# Patient Record
Sex: Female | Born: 1940
Health system: Southern US, Community
[De-identification: ages and names within clinical notes are randomized; demographics above are authoritative.]

## PROBLEM LIST (undated history)

## (undated) DIAGNOSIS — N289 Disorder of kidney and ureter, unspecified: Secondary | ICD-10-CM

## (undated) DIAGNOSIS — E871 Hypo-osmolality and hyponatremia: Secondary | ICD-10-CM

## (undated) DIAGNOSIS — F0280 Dementia in other diseases classified elsewhere without behavioral disturbance: Secondary | ICD-10-CM

## (undated) DIAGNOSIS — K219 Gastro-esophageal reflux disease without esophagitis: Secondary | ICD-10-CM

## (undated) DIAGNOSIS — N3090 Cystitis, unspecified without hematuria: Secondary | ICD-10-CM

---

## 2020-03-02 DIAGNOSIS — G4700 Insomnia, unspecified: Secondary | ICD-10-CM | POA: Diagnosis not present

## 2020-03-02 DIAGNOSIS — F0390 Unspecified dementia without behavioral disturbance: Secondary | ICD-10-CM | POA: Diagnosis not present

## 2020-03-02 DIAGNOSIS — F419 Anxiety disorder, unspecified: Secondary | ICD-10-CM | POA: Diagnosis not present

## 2020-03-02 DIAGNOSIS — F339 Major depressive disorder, recurrent, unspecified: Secondary | ICD-10-CM | POA: Diagnosis not present

## 2020-04-06 DIAGNOSIS — F339 Major depressive disorder, recurrent, unspecified: Secondary | ICD-10-CM | POA: Diagnosis not present

## 2020-04-06 DIAGNOSIS — G4700 Insomnia, unspecified: Secondary | ICD-10-CM | POA: Diagnosis not present

## 2020-04-06 DIAGNOSIS — F419 Anxiety disorder, unspecified: Secondary | ICD-10-CM | POA: Diagnosis not present

## 2020-04-06 DIAGNOSIS — F0280 Dementia in other diseases classified elsewhere without behavioral disturbance: Secondary | ICD-10-CM | POA: Diagnosis not present

## 2020-04-20 DIAGNOSIS — R1312 Dysphagia, oropharyngeal phase: Secondary | ICD-10-CM | POA: Diagnosis not present

## 2020-04-20 DIAGNOSIS — K219 Gastro-esophageal reflux disease without esophagitis: Secondary | ICD-10-CM | POA: Diagnosis not present

## 2020-04-20 DIAGNOSIS — F0390 Unspecified dementia without behavioral disturbance: Secondary | ICD-10-CM | POA: Diagnosis not present

## 2020-05-08 DIAGNOSIS — G301 Alzheimer's disease with late onset: Secondary | ICD-10-CM | POA: Diagnosis not present

## 2020-05-08 DIAGNOSIS — F0280 Dementia in other diseases classified elsewhere without behavioral disturbance: Secondary | ICD-10-CM | POA: Diagnosis not present

## 2020-05-08 DIAGNOSIS — F339 Major depressive disorder, recurrent, unspecified: Secondary | ICD-10-CM | POA: Diagnosis not present

## 2020-05-08 DIAGNOSIS — G4701 Insomnia due to medical condition: Secondary | ICD-10-CM | POA: Diagnosis not present

## 2020-05-09 DIAGNOSIS — Z9181 History of falling: Secondary | ICD-10-CM | POA: Diagnosis not present

## 2020-05-09 DIAGNOSIS — F0280 Dementia in other diseases classified elsewhere without behavioral disturbance: Secondary | ICD-10-CM | POA: Diagnosis not present

## 2020-05-09 DIAGNOSIS — Z87440 Personal history of urinary (tract) infections: Secondary | ICD-10-CM | POA: Diagnosis not present

## 2020-05-09 DIAGNOSIS — R1310 Dysphagia, unspecified: Secondary | ICD-10-CM | POA: Diagnosis not present

## 2020-05-09 DIAGNOSIS — G309 Alzheimer's disease, unspecified: Secondary | ICD-10-CM | POA: Diagnosis not present

## 2020-05-09 DIAGNOSIS — K219 Gastro-esophageal reflux disease without esophagitis: Secondary | ICD-10-CM | POA: Diagnosis not present

## 2020-05-09 DIAGNOSIS — N189 Chronic kidney disease, unspecified: Secondary | ICD-10-CM | POA: Diagnosis not present

## 2020-05-11 DIAGNOSIS — R410 Disorientation, unspecified: Secondary | ICD-10-CM | POA: Diagnosis not present

## 2020-05-11 DIAGNOSIS — F0390 Unspecified dementia without behavioral disturbance: Secondary | ICD-10-CM | POA: Diagnosis not present

## 2020-05-16 DIAGNOSIS — K219 Gastro-esophageal reflux disease without esophagitis: Secondary | ICD-10-CM | POA: Diagnosis not present

## 2020-05-16 DIAGNOSIS — R1310 Dysphagia, unspecified: Secondary | ICD-10-CM | POA: Diagnosis not present

## 2020-05-16 DIAGNOSIS — Z87440 Personal history of urinary (tract) infections: Secondary | ICD-10-CM | POA: Diagnosis not present

## 2020-05-16 DIAGNOSIS — Z9181 History of falling: Secondary | ICD-10-CM | POA: Diagnosis not present

## 2020-05-16 DIAGNOSIS — F0280 Dementia in other diseases classified elsewhere without behavioral disturbance: Secondary | ICD-10-CM | POA: Diagnosis not present

## 2020-05-16 DIAGNOSIS — G309 Alzheimer's disease, unspecified: Secondary | ICD-10-CM | POA: Diagnosis not present

## 2020-05-16 DIAGNOSIS — N189 Chronic kidney disease, unspecified: Secondary | ICD-10-CM | POA: Diagnosis not present

## 2020-05-19 DIAGNOSIS — N189 Chronic kidney disease, unspecified: Secondary | ICD-10-CM | POA: Diagnosis not present

## 2020-05-19 DIAGNOSIS — F0280 Dementia in other diseases classified elsewhere without behavioral disturbance: Secondary | ICD-10-CM | POA: Diagnosis not present

## 2020-05-19 DIAGNOSIS — G309 Alzheimer's disease, unspecified: Secondary | ICD-10-CM | POA: Diagnosis not present

## 2020-05-19 DIAGNOSIS — R1310 Dysphagia, unspecified: Secondary | ICD-10-CM | POA: Diagnosis not present

## 2020-05-19 DIAGNOSIS — K219 Gastro-esophageal reflux disease without esophagitis: Secondary | ICD-10-CM | POA: Diagnosis not present

## 2020-05-19 DIAGNOSIS — Z87440 Personal history of urinary (tract) infections: Secondary | ICD-10-CM | POA: Diagnosis not present

## 2020-05-19 DIAGNOSIS — Z9181 History of falling: Secondary | ICD-10-CM | POA: Diagnosis not present

## 2020-05-22 DIAGNOSIS — S01512A Laceration without foreign body of oral cavity, initial encounter: Secondary | ICD-10-CM | POA: Diagnosis not present

## 2020-05-22 DIAGNOSIS — S022XXA Fracture of nasal bones, initial encounter for closed fracture: Secondary | ICD-10-CM | POA: Diagnosis not present

## 2020-05-22 DIAGNOSIS — S0011XA Contusion of right eyelid and periocular area, initial encounter: Secondary | ICD-10-CM | POA: Diagnosis not present

## 2020-05-22 DIAGNOSIS — J342 Deviated nasal septum: Secondary | ICD-10-CM | POA: Diagnosis not present

## 2020-05-22 DIAGNOSIS — W19XXXA Unspecified fall, initial encounter: Secondary | ICD-10-CM | POA: Diagnosis not present

## 2020-05-22 DIAGNOSIS — R609 Edema, unspecified: Secondary | ICD-10-CM | POA: Diagnosis not present

## 2020-05-22 DIAGNOSIS — Z043 Encounter for examination and observation following other accident: Secondary | ICD-10-CM | POA: Diagnosis not present

## 2020-05-23 DIAGNOSIS — Z87440 Personal history of urinary (tract) infections: Secondary | ICD-10-CM | POA: Diagnosis not present

## 2020-05-23 DIAGNOSIS — N189 Chronic kidney disease, unspecified: Secondary | ICD-10-CM | POA: Diagnosis not present

## 2020-05-23 DIAGNOSIS — F0280 Dementia in other diseases classified elsewhere without behavioral disturbance: Secondary | ICD-10-CM | POA: Diagnosis not present

## 2020-05-23 DIAGNOSIS — R1310 Dysphagia, unspecified: Secondary | ICD-10-CM | POA: Diagnosis not present

## 2020-05-23 DIAGNOSIS — K219 Gastro-esophageal reflux disease without esophagitis: Secondary | ICD-10-CM | POA: Diagnosis not present

## 2020-05-23 DIAGNOSIS — Z9181 History of falling: Secondary | ICD-10-CM | POA: Diagnosis not present

## 2020-05-23 DIAGNOSIS — G309 Alzheimer's disease, unspecified: Secondary | ICD-10-CM | POA: Diagnosis not present

## 2020-05-25 DIAGNOSIS — R5381 Other malaise: Secondary | ICD-10-CM | POA: Diagnosis not present

## 2020-05-25 DIAGNOSIS — W19XXXA Unspecified fall, initial encounter: Secondary | ICD-10-CM | POA: Diagnosis not present

## 2020-05-25 DIAGNOSIS — F0390 Unspecified dementia without behavioral disturbance: Secondary | ICD-10-CM | POA: Diagnosis not present

## 2020-05-25 DIAGNOSIS — R296 Repeated falls: Secondary | ICD-10-CM | POA: Diagnosis not present

## 2020-05-25 DIAGNOSIS — S022XXA Fracture of nasal bones, initial encounter for closed fracture: Secondary | ICD-10-CM | POA: Diagnosis not present

## 2020-05-26 ENCOUNTER — Other Ambulatory Visit: Payer: Self-pay

## 2020-05-26 ENCOUNTER — Emergency Department (HOSPITAL_COMMUNITY): Payer: Medicare HMO

## 2020-05-26 ENCOUNTER — Observation Stay (HOSPITAL_COMMUNITY): Payer: Medicare HMO

## 2020-05-26 ENCOUNTER — Inpatient Hospital Stay (HOSPITAL_COMMUNITY)
Admission: EM | Admit: 2020-05-26 | Discharge: 2020-05-31 | DRG: 085 | Disposition: A | Discharge status: Home Health | Payer: Medicare HMO | Source: Skilled Nursing Facility | Attending: Internal Medicine | Admitting: Internal Medicine

## 2020-05-26 ENCOUNTER — Encounter (HOSPITAL_COMMUNITY): Payer: Self-pay | Admitting: Emergency Medicine

## 2020-05-26 DIAGNOSIS — G309 Alzheimer's disease, unspecified: Secondary | ICD-10-CM | POA: Diagnosis present

## 2020-05-26 DIAGNOSIS — S0191XA Laceration without foreign body of unspecified part of head, initial encounter: Secondary | ICD-10-CM | POA: Diagnosis not present

## 2020-05-26 DIAGNOSIS — F0280 Dementia in other diseases classified elsewhere without behavioral disturbance: Secondary | ICD-10-CM | POA: Diagnosis present

## 2020-05-26 DIAGNOSIS — E785 Hyperlipidemia, unspecified: Secondary | ICD-10-CM | POA: Diagnosis present

## 2020-05-26 DIAGNOSIS — S0181XA Laceration without foreign body of other part of head, initial encounter: Secondary | ICD-10-CM | POA: Diagnosis not present

## 2020-05-26 DIAGNOSIS — Z23 Encounter for immunization: Secondary | ICD-10-CM

## 2020-05-26 DIAGNOSIS — Z882 Allergy status to sulfonamides status: Secondary | ICD-10-CM | POA: Diagnosis not present

## 2020-05-26 DIAGNOSIS — S0011XA Contusion of right eyelid and periocular area, initial encounter: Secondary | ICD-10-CM | POA: Diagnosis present

## 2020-05-26 DIAGNOSIS — Z8249 Family history of ischemic heart disease and other diseases of the circulatory system: Secondary | ICD-10-CM

## 2020-05-26 DIAGNOSIS — W07XXXA Fall from chair, initial encounter: Secondary | ICD-10-CM | POA: Diagnosis present

## 2020-05-26 DIAGNOSIS — M5030 Other cervical disc degeneration, unspecified cervical region: Secondary | ICD-10-CM | POA: Diagnosis not present

## 2020-05-26 DIAGNOSIS — K219 Gastro-esophageal reflux disease without esophagitis: Secondary | ICD-10-CM | POA: Diagnosis present

## 2020-05-26 DIAGNOSIS — J810 Acute pulmonary edema: Secondary | ICD-10-CM

## 2020-05-26 DIAGNOSIS — I6389 Other cerebral infarction: Secondary | ICD-10-CM | POA: Diagnosis not present

## 2020-05-26 DIAGNOSIS — I672 Cerebral atherosclerosis: Secondary | ICD-10-CM | POA: Diagnosis not present

## 2020-05-26 DIAGNOSIS — R0902 Hypoxemia: Secondary | ICD-10-CM | POA: Diagnosis not present

## 2020-05-26 DIAGNOSIS — J9811 Atelectasis: Secondary | ICD-10-CM | POA: Diagnosis not present

## 2020-05-26 DIAGNOSIS — K317 Polyp of stomach and duodenum: Secondary | ICD-10-CM | POA: Diagnosis not present

## 2020-05-26 DIAGNOSIS — S0012XA Contusion of left eyelid and periocular area, initial encounter: Secondary | ICD-10-CM | POA: Diagnosis present

## 2020-05-26 DIAGNOSIS — G9608 Other cranial cerebrospinal fluid leak: Secondary | ICD-10-CM | POA: Diagnosis present

## 2020-05-26 DIAGNOSIS — Z20822 Contact with and (suspected) exposure to covid-19: Secondary | ICD-10-CM | POA: Diagnosis present

## 2020-05-26 DIAGNOSIS — Z79899 Other long term (current) drug therapy: Secondary | ICD-10-CM | POA: Diagnosis not present

## 2020-05-26 DIAGNOSIS — R9431 Abnormal electrocardiogram [ECG] [EKG]: Secondary | ICD-10-CM | POA: Diagnosis not present

## 2020-05-26 DIAGNOSIS — S065X9A Traumatic subdural hemorrhage with loss of consciousness of unspecified duration, initial encounter: Secondary | ICD-10-CM

## 2020-05-26 DIAGNOSIS — K922 Gastrointestinal hemorrhage, unspecified: Secondary | ICD-10-CM

## 2020-05-26 DIAGNOSIS — Z823 Family history of stroke: Secondary | ICD-10-CM | POA: Diagnosis not present

## 2020-05-26 DIAGNOSIS — H510 Palsy (spasm) of conjugate gaze: Secondary | ICD-10-CM | POA: Diagnosis present

## 2020-05-26 DIAGNOSIS — J189 Pneumonia, unspecified organism: Secondary | ICD-10-CM | POA: Diagnosis not present

## 2020-05-26 DIAGNOSIS — D62 Acute posthemorrhagic anemia: Secondary | ICD-10-CM | POA: Diagnosis present

## 2020-05-26 DIAGNOSIS — F419 Anxiety disorder, unspecified: Secondary | ICD-10-CM | POA: Diagnosis present

## 2020-05-26 DIAGNOSIS — Z8262 Family history of osteoporosis: Secondary | ICD-10-CM | POA: Diagnosis not present

## 2020-05-26 DIAGNOSIS — Q399 Congenital malformation of esophagus, unspecified: Secondary | ICD-10-CM | POA: Diagnosis not present

## 2020-05-26 DIAGNOSIS — I639 Cerebral infarction, unspecified: Secondary | ICD-10-CM

## 2020-05-26 DIAGNOSIS — Z743 Need for continuous supervision: Secondary | ICD-10-CM | POA: Diagnosis not present

## 2020-05-26 DIAGNOSIS — S0990XA Unspecified injury of head, initial encounter: Secondary | ICD-10-CM | POA: Diagnosis not present

## 2020-05-26 DIAGNOSIS — R0602 Shortness of breath: Secondary | ICD-10-CM | POA: Diagnosis not present

## 2020-05-26 DIAGNOSIS — K449 Diaphragmatic hernia without obstruction or gangrene: Secondary | ICD-10-CM | POA: Diagnosis present

## 2020-05-26 DIAGNOSIS — R410 Disorientation, unspecified: Secondary | ICD-10-CM | POA: Diagnosis not present

## 2020-05-26 DIAGNOSIS — D649 Anemia, unspecified: Secondary | ICD-10-CM

## 2020-05-26 DIAGNOSIS — R296 Repeated falls: Secondary | ICD-10-CM | POA: Diagnosis present

## 2020-05-26 DIAGNOSIS — G9389 Other specified disorders of brain: Secondary | ICD-10-CM | POA: Diagnosis not present

## 2020-05-26 DIAGNOSIS — D509 Iron deficiency anemia, unspecified: Secondary | ICD-10-CM | POA: Diagnosis present

## 2020-05-26 DIAGNOSIS — S065X0A Traumatic subdural hemorrhage without loss of consciousness, initial encounter: Principal | ICD-10-CM | POA: Diagnosis present

## 2020-05-26 DIAGNOSIS — S199XXA Unspecified injury of neck, initial encounter: Secondary | ICD-10-CM | POA: Diagnosis not present

## 2020-05-26 DIAGNOSIS — R279 Unspecified lack of coordination: Secondary | ICD-10-CM | POA: Diagnosis not present

## 2020-05-26 DIAGNOSIS — I959 Hypotension, unspecified: Secondary | ICD-10-CM | POA: Diagnosis not present

## 2020-05-26 DIAGNOSIS — E876 Hypokalemia: Secondary | ICD-10-CM | POA: Diagnosis present

## 2020-05-26 DIAGNOSIS — D500 Iron deficiency anemia secondary to blood loss (chronic): Secondary | ICD-10-CM | POA: Diagnosis present

## 2020-05-26 DIAGNOSIS — I2510 Atherosclerotic heart disease of native coronary artery without angina pectoris: Secondary | ICD-10-CM | POA: Diagnosis not present

## 2020-05-26 DIAGNOSIS — Y92129 Unspecified place in nursing home as the place of occurrence of the external cause: Secondary | ICD-10-CM

## 2020-05-26 DIAGNOSIS — R195 Other fecal abnormalities: Secondary | ICD-10-CM | POA: Diagnosis not present

## 2020-05-26 DIAGNOSIS — S0101XA Laceration without foreign body of scalp, initial encounter: Secondary | ICD-10-CM | POA: Diagnosis not present

## 2020-05-26 DIAGNOSIS — S0003XA Contusion of scalp, initial encounter: Secondary | ICD-10-CM | POA: Diagnosis present

## 2020-05-26 DIAGNOSIS — D181 Lymphangioma, any site: Secondary | ICD-10-CM | POA: Diagnosis not present

## 2020-05-26 DIAGNOSIS — J90 Pleural effusion, not elsewhere classified: Secondary | ICD-10-CM | POA: Diagnosis present

## 2020-05-26 DIAGNOSIS — W19XXXA Unspecified fall, initial encounter: Secondary | ICD-10-CM | POA: Diagnosis not present

## 2020-05-26 DIAGNOSIS — K257 Chronic gastric ulcer without hemorrhage or perforation: Secondary | ICD-10-CM | POA: Diagnosis not present

## 2020-05-26 DIAGNOSIS — K259 Gastric ulcer, unspecified as acute or chronic, without hemorrhage or perforation: Secondary | ICD-10-CM | POA: Diagnosis present

## 2020-05-26 DIAGNOSIS — Z8673 Personal history of transient ischemic attack (TIA), and cerebral infarction without residual deficits: Secondary | ICD-10-CM | POA: Diagnosis not present

## 2020-05-26 DIAGNOSIS — S065XAA Traumatic subdural hemorrhage with loss of consciousness status unknown, initial encounter: Secondary | ICD-10-CM

## 2020-05-26 HISTORY — DX: Dementia in other diseases classified elsewhere, unspecified severity, without behavioral disturbance, psychotic disturbance, mood disturbance, and anxiety: F02.80

## 2020-05-26 HISTORY — DX: Disorder of kidney and ureter, unspecified: N28.9

## 2020-05-26 HISTORY — DX: Gastro-esophageal reflux disease without esophagitis: K21.9

## 2020-05-26 HISTORY — DX: Cystitis, unspecified without hematuria: N30.90

## 2020-05-26 HISTORY — DX: Hypo-osmolality and hyponatremia: E87.1

## 2020-05-26 LAB — BASIC METABOLIC PANEL
Anion gap: 10 (ref 5–15)
BUN: 12 mg/dL (ref 8–23)
CO2: 24 mmol/L (ref 22–32)
Calcium: 8.8 mg/dL — ABNORMAL LOW (ref 8.9–10.3)
Chloride: 101 mmol/L (ref 98–111)
Creatinine, Ser: 1.02 mg/dL — ABNORMAL HIGH (ref 0.44–1.00)
GFR, Estimated: 56 mL/min — ABNORMAL LOW (ref >60)
Glucose, Bld: 136 mg/dL — ABNORMAL HIGH (ref 70–99)
Potassium: 3.3 mmol/L — ABNORMAL LOW (ref 3.5–5.1)
Sodium: 135 mmol/L (ref 135–145)

## 2020-05-26 LAB — CBC
HCT: 18.4 % — ABNORMAL LOW (ref 36.0–46.0)
HCT: 16.4 % — ABNORMAL LOW (ref 36.0–46.0)
Hemoglobin: 4.6 g/dL — CL (ref 12.0–15.0)
Hemoglobin: 4 g/dL — CL (ref 12.0–15.0)
MCH: 14.8 pg — ABNORMAL LOW (ref 26.0–34.0)
MCH: 14.5 pg — ABNORMAL LOW (ref 26.0–34.0)
MCHC: 25 g/dL — ABNORMAL LOW (ref 30.0–36.0)
MCHC: 24.4 g/dL — ABNORMAL LOW (ref 30.0–36.0)
MCV: 59.4 fL — ABNORMAL LOW (ref 80.0–100.0)
MCV: 59.4 fL — ABNORMAL LOW (ref 80.0–100.0)
Platelets: 274 10*3/uL (ref 150–400)
Platelets: 238 10*3/uL (ref 150–400)
RBC: 3.1 MIL/uL — ABNORMAL LOW (ref 3.87–5.11)
RBC: 2.76 MIL/uL — ABNORMAL LOW (ref 3.87–5.11)
RDW: 24 % — ABNORMAL HIGH (ref 11.5–15.5)
RDW: 23.9 % — ABNORMAL HIGH (ref 11.5–15.5)
WBC: 9.3 10*3/uL (ref 4.0–10.5)
WBC: 8.4 10*3/uL (ref 4.0–10.5)
nRBC: 0.9 % — ABNORMAL HIGH (ref 0.0–0.2)
nRBC: 0.6 % — ABNORMAL HIGH (ref 0.0–0.2)

## 2020-05-26 LAB — IRON AND TIBC
Iron: 32 ug/dL (ref 28–170)
Saturation Ratios: 7 % — ABNORMAL LOW (ref 10.4–31.8)
TIBC: 462 ug/dL — ABNORMAL HIGH (ref 250–450)
UIBC: 430 ug/dL

## 2020-05-26 LAB — FERRITIN: Ferritin: 7 ng/mL — ABNORMAL LOW (ref 11–307)

## 2020-05-26 LAB — LIPID PANEL
Cholesterol: 90 mg/dL (ref 0–200)
HDL: 34 mg/dL — ABNORMAL LOW (ref >40)
LDL Cholesterol: 44 mg/dL (ref 0–99)
Total CHOL/HDL Ratio: 2.6 RATIO
Triglycerides: 60 mg/dL (ref <150)
VLDL: 12 mg/dL (ref 0–40)

## 2020-05-26 LAB — HEMOGLOBIN A1C
Hgb A1c MFr Bld: 5.5 % (ref 4.8–5.6)
Mean Plasma Glucose: 111.15 mg/dL

## 2020-05-26 LAB — ABO/RH: ABO/RH(D): O POS

## 2020-05-26 LAB — PREPARE RBC (CROSSMATCH)

## 2020-05-26 LAB — POC OCCULT BLOOD, ED: Fecal Occult Bld: POSITIVE — AB

## 2020-05-26 IMAGING — MR MR HEAD W/O CM
6 series · 48 of 48 positions shown · non-contrast
Comparison: Prior head CT from earlier the same day.

CLINICAL DATA: Initial evaluation for neuro deficit, stroke
suspected.

EXAM:
MRI HEAD WITHOUT CONTRAST
TECHNIQUE: Multiplanar, multiecho pulse sequences of the brain and surrounding
structures were obtained without intravenous contrast.

[Series 9: DWI · axial · 3.0mm · 0.88mm/px · z∈[-121,+18]mm · 15 of 96 slices shown (1 of 4)]
[im 1/96]
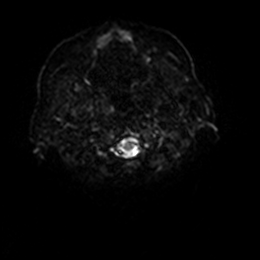
[im 7/96]
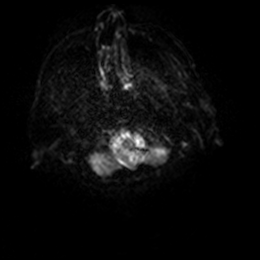
[im 14/96]
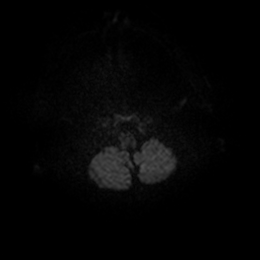
[im 21/96]
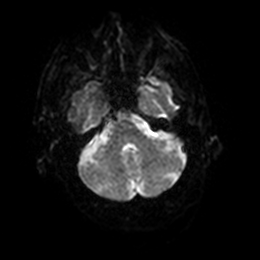
[im 28/96]
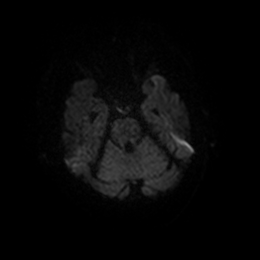
[im 34/96]
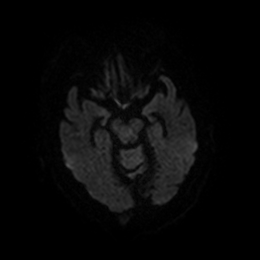
[im 41/96]
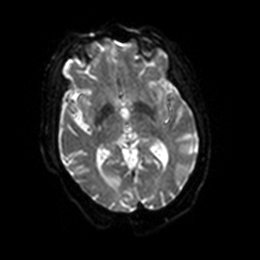
[im 48/96]
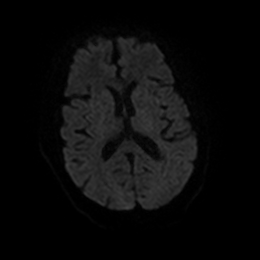
[im 55/96]
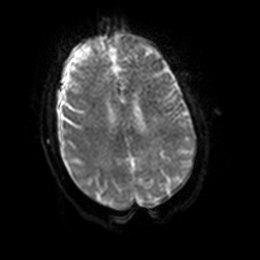
[im 62/96]
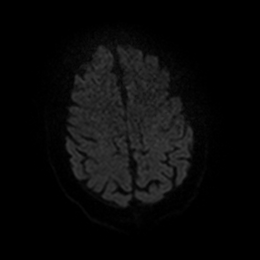
[im 68/96]
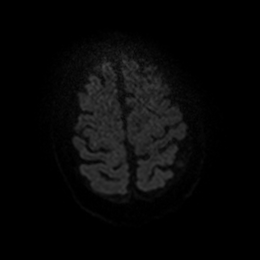
[im 75/96]
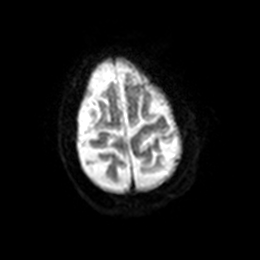
[im 82/96]
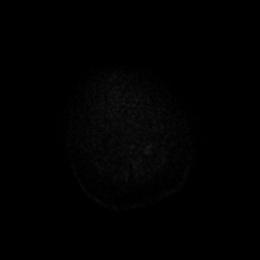
[im 89/96]
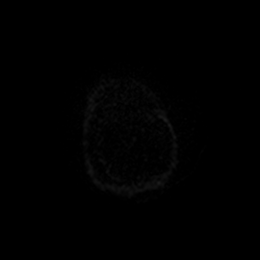
[im 96/96]
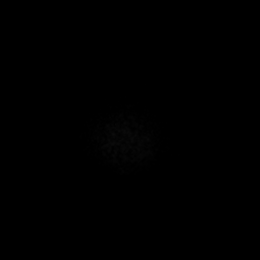

[Series 10: DWI · axial · 3.0mm · 0.88mm/px · z∈[-121,+18]mm · 8 of 48 slices shown (2 of 4)]
[im 1/48]
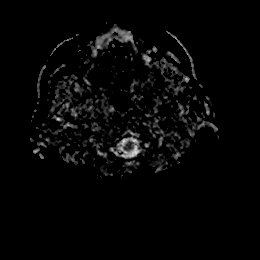
[im 7/48]
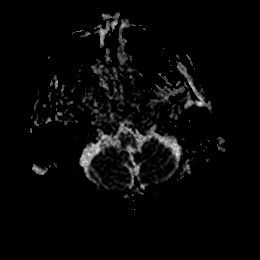
[im 14/48]
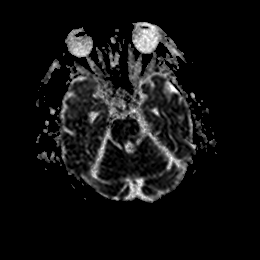
[im 21/48]
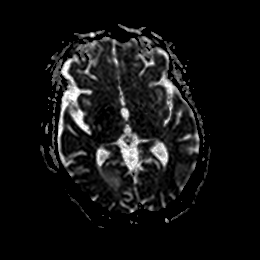
[im 27/48]
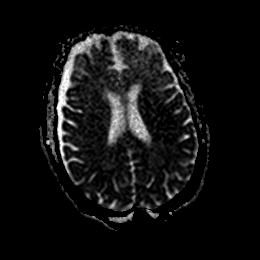
[im 34/48]
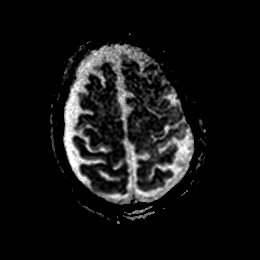
[im 41/48]
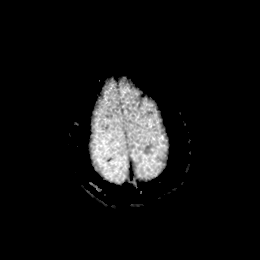
[im 48/48]
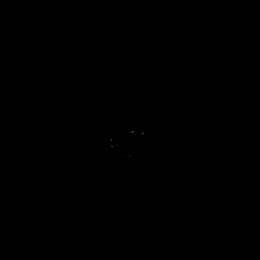

[Series 11: DWI · coronal · 4.0mm · 0.88mm/px · 11 of 72 slices shown (3 of 4)]
[im 1/72]
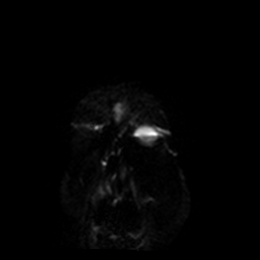
[im 8/72]
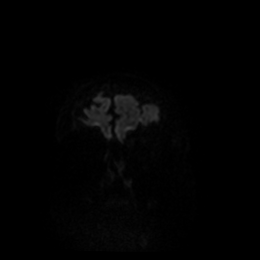
[im 15/72]
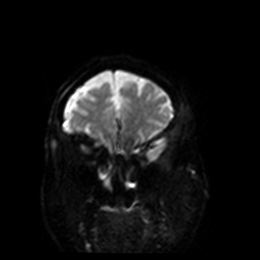
[im 22/72]
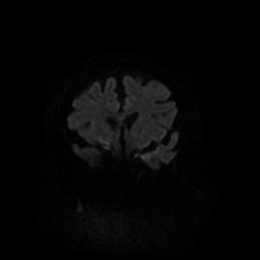
[im 29/72]
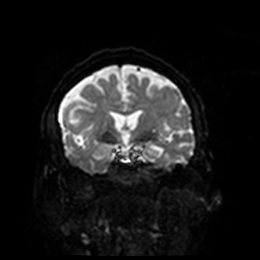
[im 36/72]
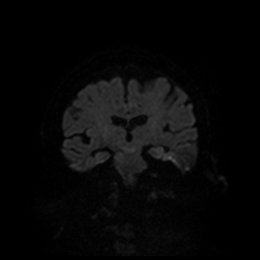
[im 43/72]
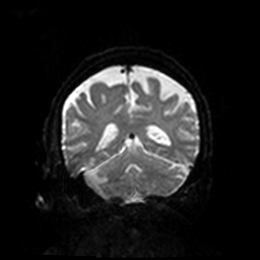
[im 50/72]
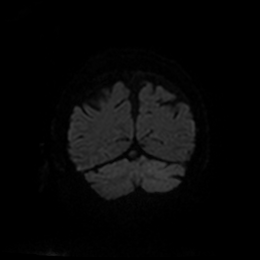
[im 57/72]
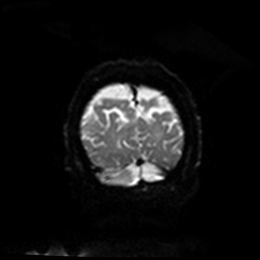
[im 64/72]
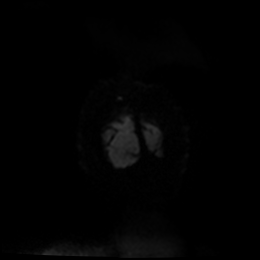
[im 72/72]
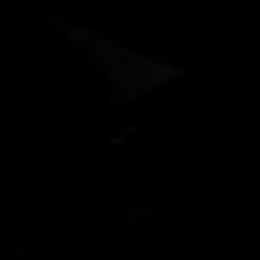

[Series 12: DWI · coronal · 4.0mm · 0.88mm/px · 6 of 36 slices shown (4 of 4)]
[im 1/36]
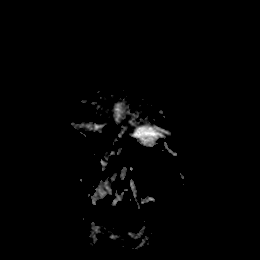
[im 8/36]
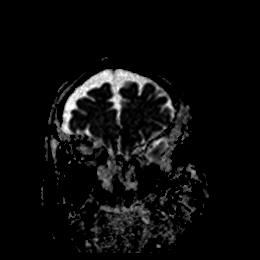
[im 15/36]
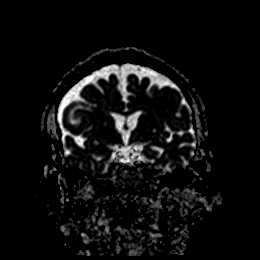
[im 22/36]
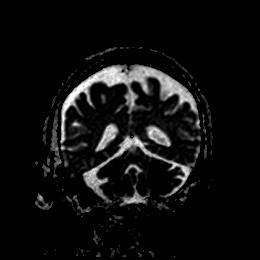
[im 29/36]
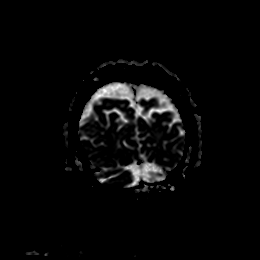
[im 36/36]
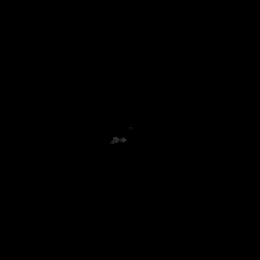

[Series 14: FLAIR · axial · 5.0mm · 0.90mm/px · z∈[-122,+19]mm · 4 of 25 slices shown]
[im 1/25]
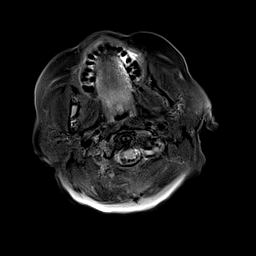
[im 9/25]
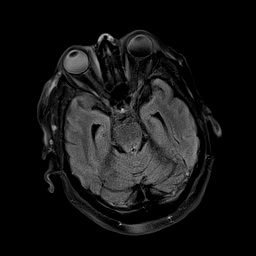
[im 17/25]
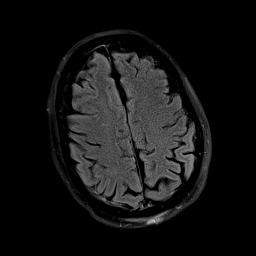
[im 25/25]
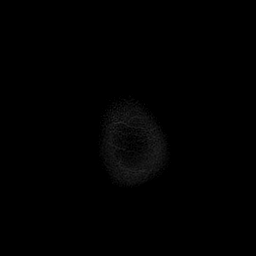

[Series 15: ax hemo · axial · 5.0mm · 0.86mm/px · z∈[-119,+23]mm · 4 of 25 slices shown]
[im 1/25]
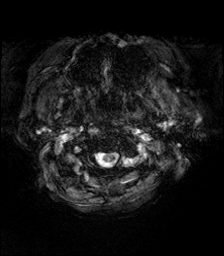
[im 9/25]
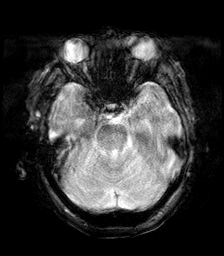
[im 17/25]
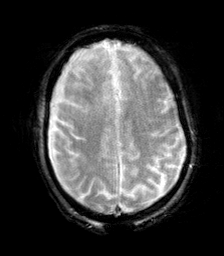
[im 25/25]
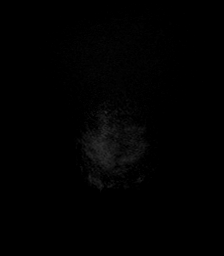

[48 of 48 positions shown; findings below may reference images not displayed]

FINDINGS: Brain: Examination technically limited as the patient was unable to
tolerate the full length of the exam. Diffusion-weighted sequences
with axial FLAIR and gradient echo sequences only performed.
Additionally, provided images are degraded by motion.

A subtle approximate 2 cm area of patchy diffusion abnormality seen
involving the subcortical right occipital lobe (series 9, image 67
on axial DWI sequence, series 11, image 46 on coronal DWI sequence.
Corresponding FLAIR signal abnormality seen within this region
(series 14, image 11). On previous head CT from earlier the same
day, there is an approximate 2 cm focal hypodensity at this location
(series 4, image 14 on previous exam). Additionally, this is not
clearly seen on prior head CT from [DATE], consistent with a new
finding. Finding consistent with an early subacute ischemic infarct.
Probable faint associated petechial hemorrhage (series 15, image
11). No frank hemorrhagic transformation or significant mass effect.

No other diffusion abnormality to suggest acute or subacute
ischemia. Gray-white matter differentiation otherwise maintained. No
other appreciable areas of chronic cortical infarction. No other
evidence for acute or chronic intracranial hemorrhage on this motion
degraded exam.

No mass lesion, midline shift or mass effect. No hydrocephalus or
extra-axial fluid collection.

Vascular: Major intracranial vascular flow voids are grossly
maintained at the skull base on this limited exam.

Skull and upper cervical spine: Craniocervical junction grossly
within normal limits. No obvious focal marrow replacing lesion.

Sinuses/Orbits: Globes and orbital soft tissues grossly within
normal limits. Paranasal sinuses are largely clear.

Other: None.
IMPRESSION: 1. Technically limited exam due to the patient's inability to
tolerate the full length of the study.
2. Approximate 2 cm area of patchy diffusion and FLAIR abnormality
involving the subcortical right occipital lobe, consistent with an
early subacute ischemic infarct. Probable faint associated petechial
hemorrhage without frank hemorrhagic transformation or significant
mass effect.
3. No other definite acute intracranial abnormality on this limited
exam.

## 2020-05-26 IMAGING — CT CT CERVICAL SPINE W/O CM
4 series · 15 of 34 positions shown, 18 images · non-contrast
Comparison: [DATE]

CLINICAL DATA: Minor head trauma.  Laceration to back of head.

EXAM:
CT HEAD WITHOUT CONTRAST
CT CERVICAL SPINE WITHOUT CONTRAST
TECHNIQUE: Multidetector CT imaging of the head and cervical spine was
performed following the standard protocol without intravenous
contrast. Multiplanar CT image reconstructions of the cervical spine
were also generated.

[Series 5: c_spine 2.0 st · axial · 0.34mm/px · z∈[-304,-184]mm · 5 of 92 slices shown, 7 images]
[im 16/92  soft-tissue]
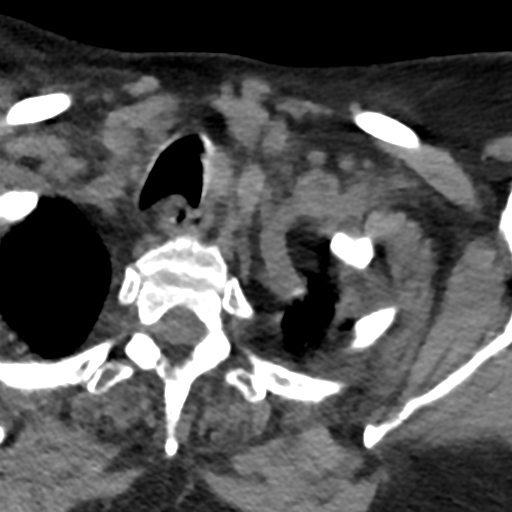
[im 16/92  bone]
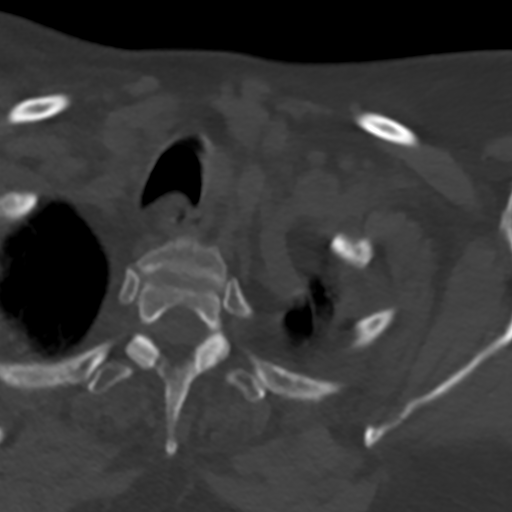
[im 31/92  bone]
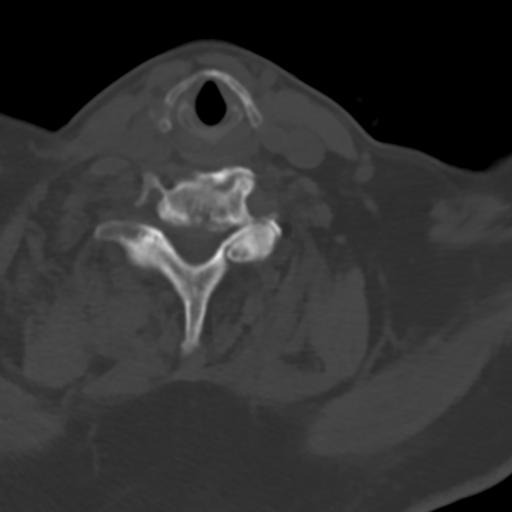
[im 46/92  bone]
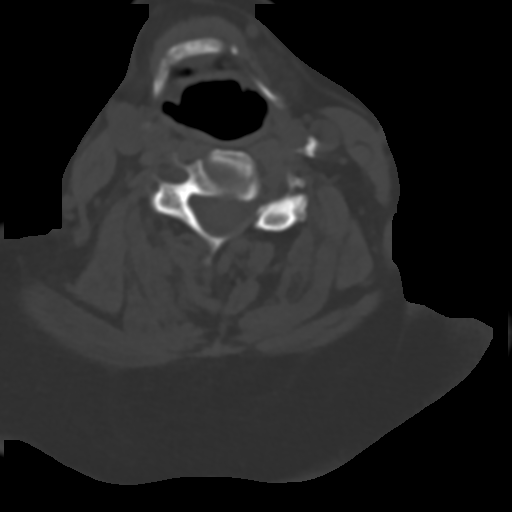
[im 61/92  bone]
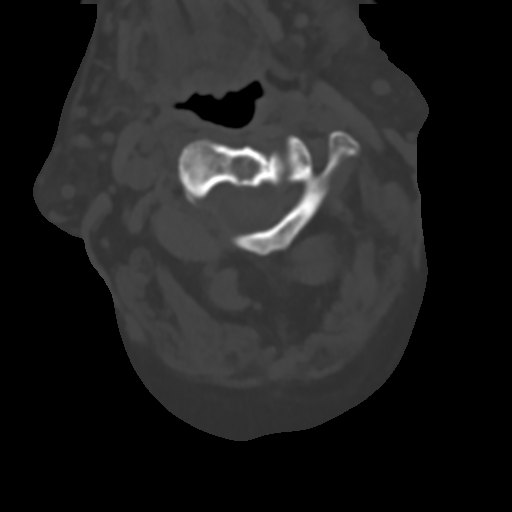
[im 76/92  soft-tissue]
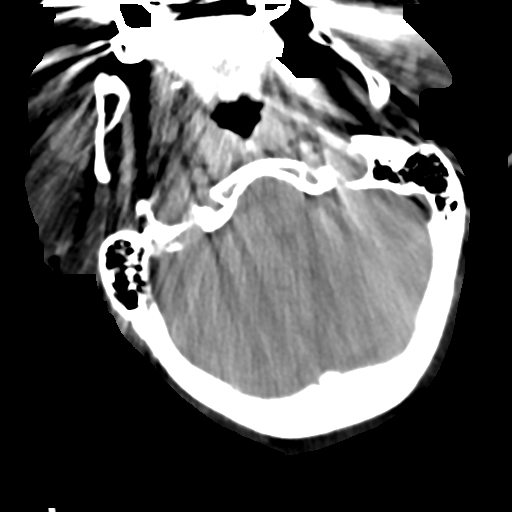
[im 76/92  bone]
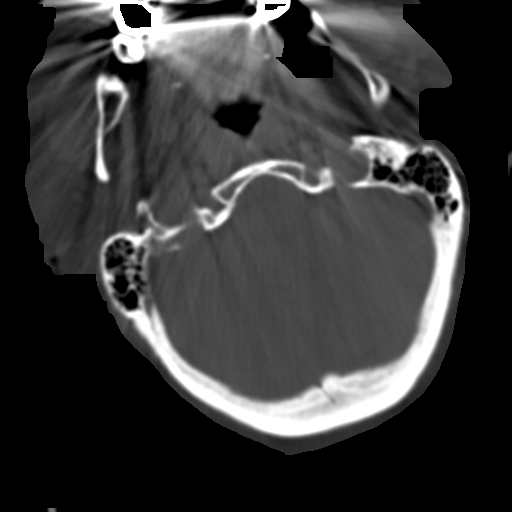

[Series 6: coronal bone · coronal · 0.37mm/px · 3 of 66 slices shown]
[im 14/66  bone]
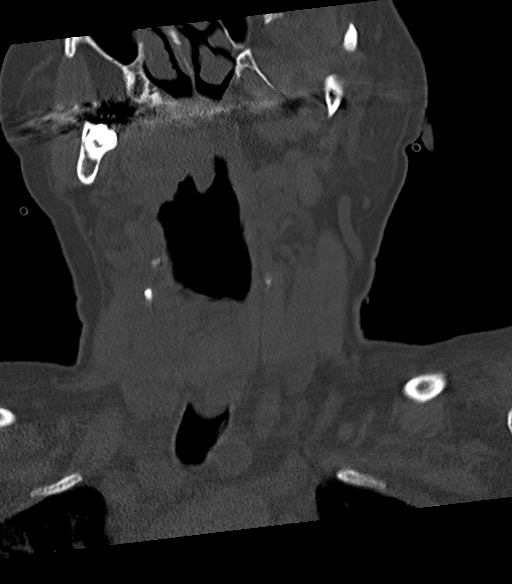
[im 27/66  bone]
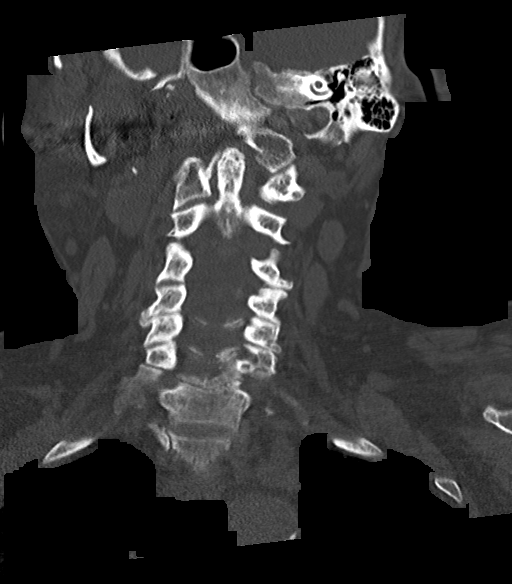
[im 40/66  bone]
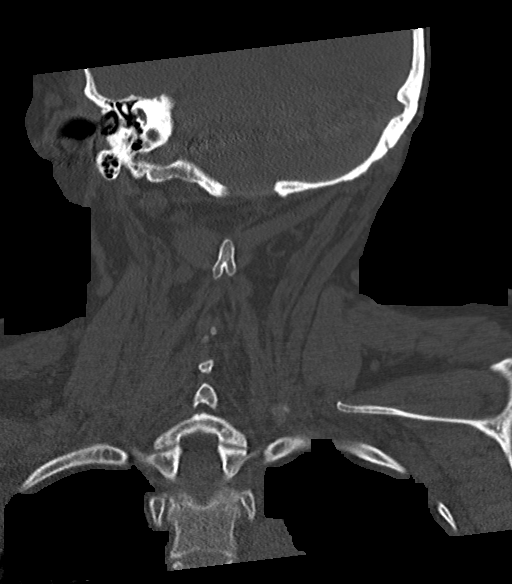

[Series 7: sagittal bone · sagittal · 0.37mm/px · 5 of 59 slices shown, 6 images]
[im 20/59  bone]
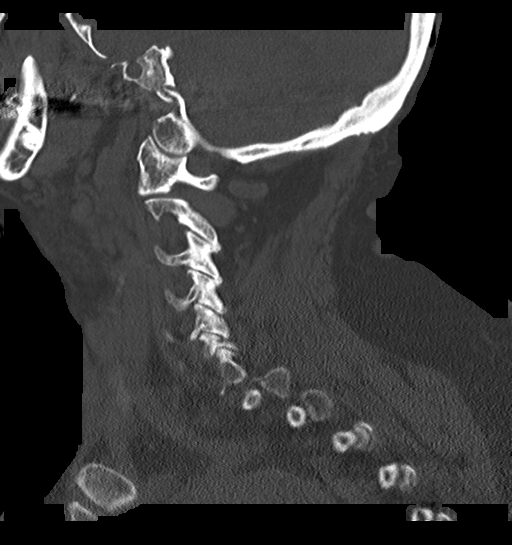
[im 25/59  bone]
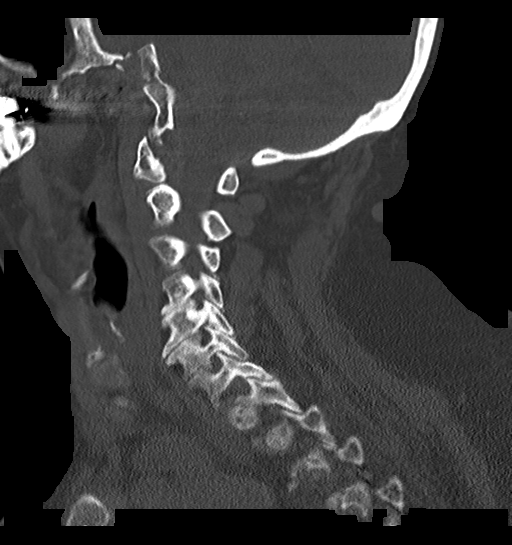
[im 30/59  soft-tissue]
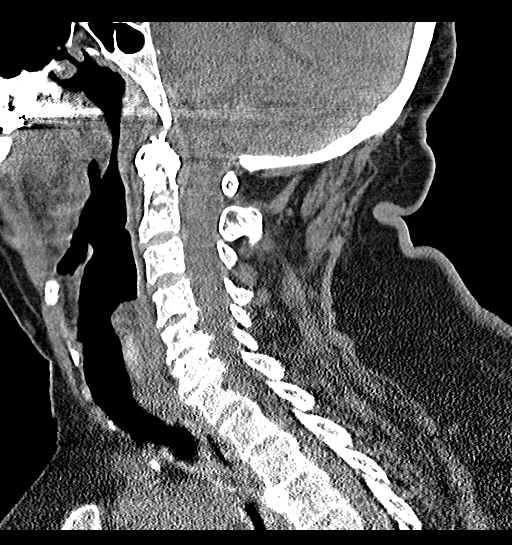
[im 30/59  bone]
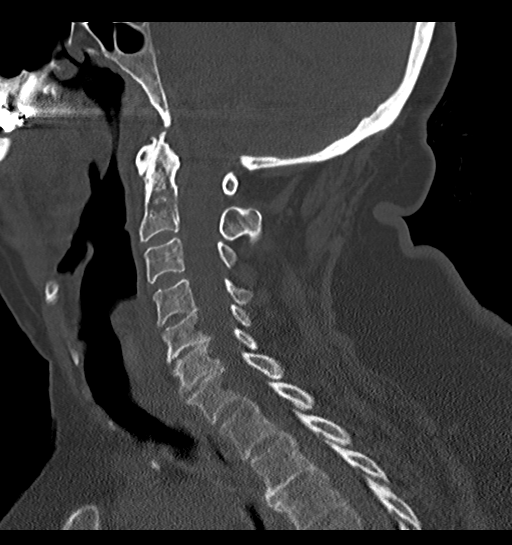
[im 34/59  bone]
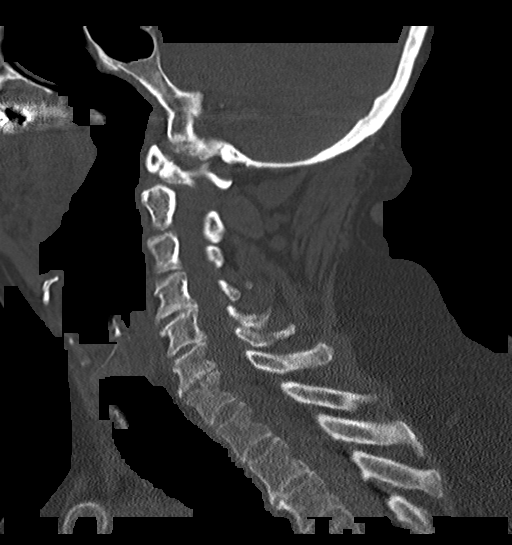
[im 39/59  bone]
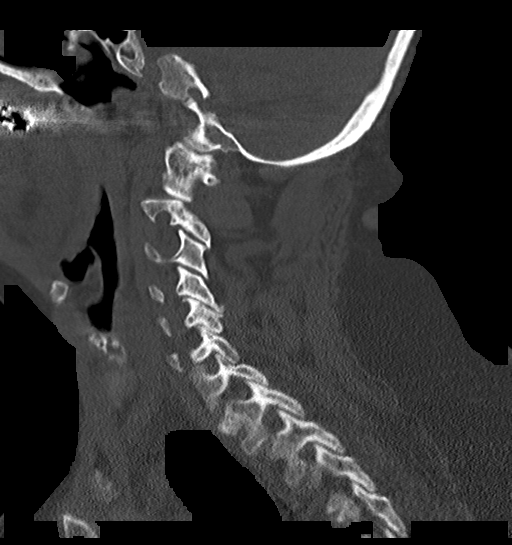

[Series 8: orthogonal axial bone · oblique · 0.21mm/px · 2 of 92 slices shown]
[im 16/92  bone]
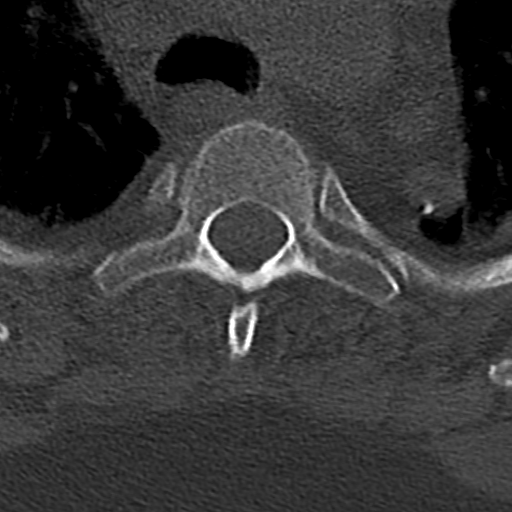
[im 31/92  bone]
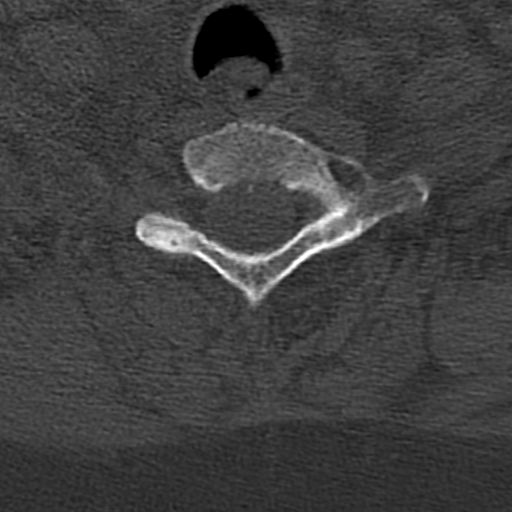

[15 of 34 positions shown; findings below may reference images not displayed]

FINDINGS: CT HEAD FINDINGS

Brain: Small low-density cortical area in the inferior right
occipital lobe since prior.

On coronal reformats there is question of a low-density subdural
collection displacing cortical veins on both sides of the cerebral
convexities, measuring up to 3 or 4 mm in thickness. No high-density
hemorrhage. No hydrocephalus.

Vascular: No hyperdense vessel or unexpected calcification.

Skull: Normal. Negative for fracture or focal lesion.

Sinuses/Orbits: No acute finding.

CT CERVICAL SPINE FINDINGS

Alignment: No traumatic malalignment

Skull base and vertebrae: No acute fracture

Soft tissues and spinal canal: No prevertebral fluid or swelling. No
visible canal hematoma.

Disc levels:  Ordinary cervical spine degeneration

Upper chest: No acute finding
IMPRESSION: 1. Small acute infarct in the right occipital cortex that has
occurred since 4 days prior.
2. Suspect trace bilateral subdural hygroma, attention on presumed
follow-up MRI.
3. Negative for cervical spine fracture.

## 2020-05-26 IMAGING — CT CT HEAD W/O CM
3 series · 15 of 47 positions shown, 18 images · non-contrast
Comparison: [DATE]

CLINICAL DATA: Minor head trauma.  Laceration to back of head.

EXAM:
CT HEAD WITHOUT CONTRAST
CT CERVICAL SPINE WITHOUT CONTRAST
TECHNIQUE: Multidetector CT imaging of the head and cervical spine was
performed following the standard protocol without intravenous
contrast. Multiplanar CT image reconstructions of the cervical spine
were also generated.

[Series 4: head 5.0 h30s · axial · 0.39mm/px · z∈[-183,-48]mm · 9 of 33 slices shown, 12 images]
[im 3/33  brain]
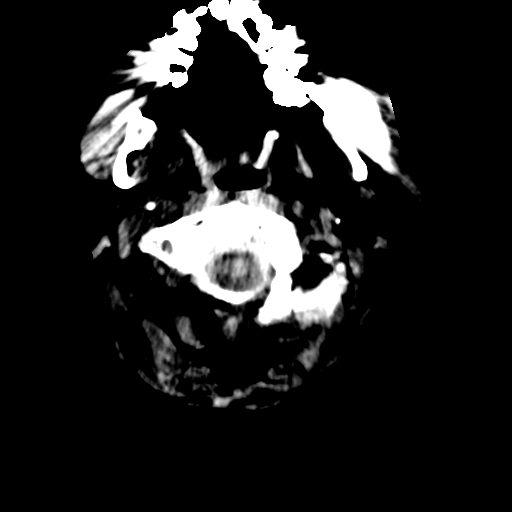
[im 3/33  bone]
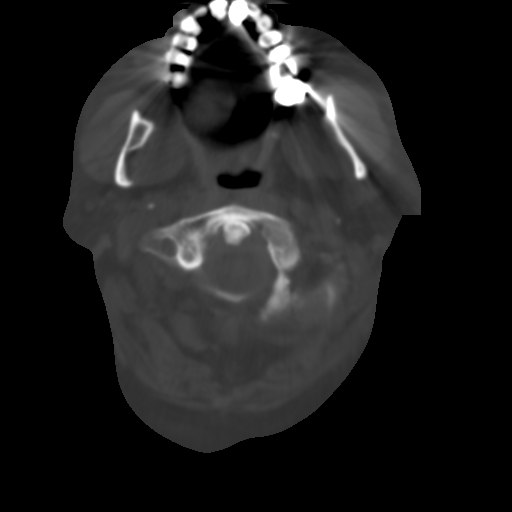
[im 6/33  brain]
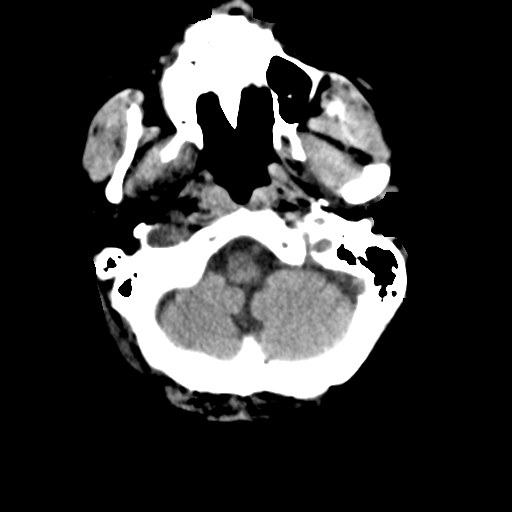
[im 9/33  brain]
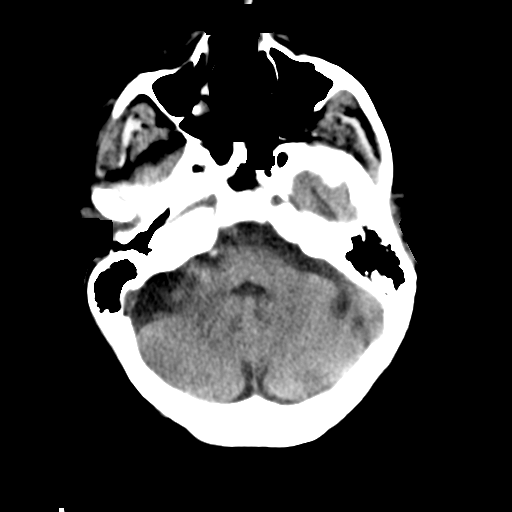
[im 13/33  brain]
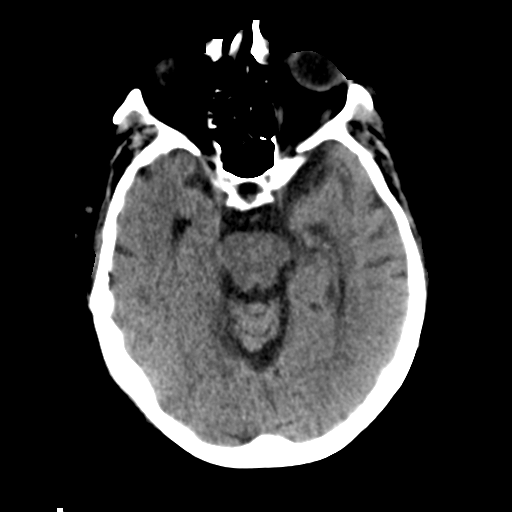
[im 17/33  brain]
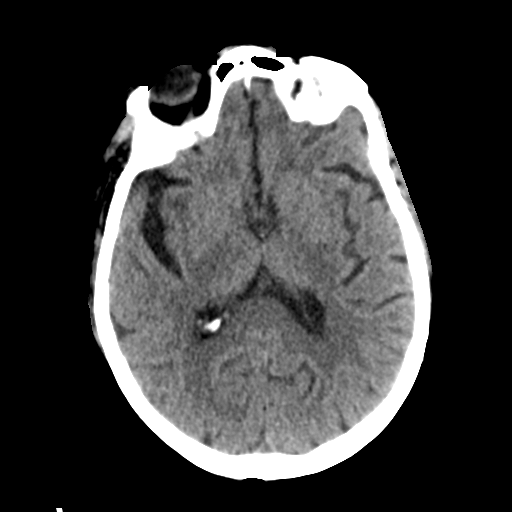
[im 17/33  bone]
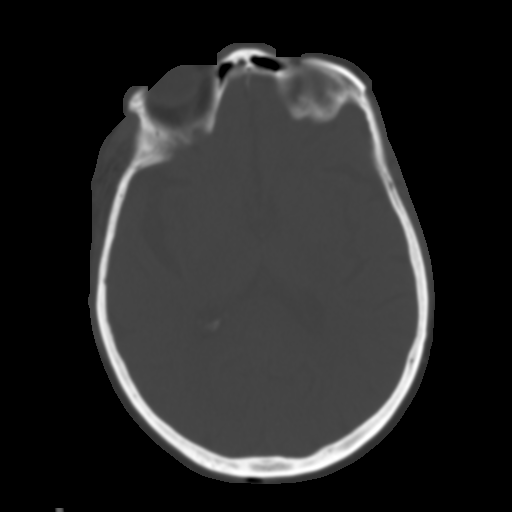
[im 20/33  brain]
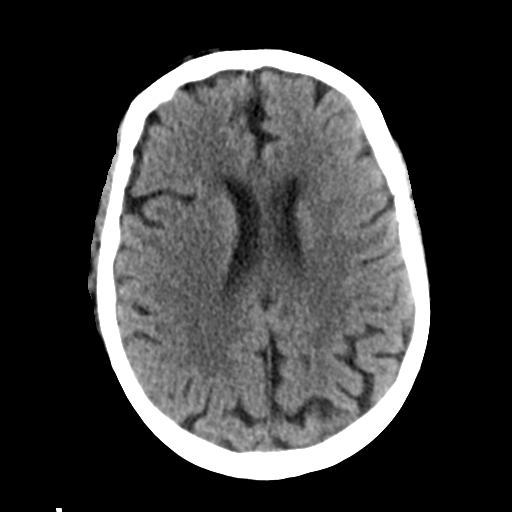
[im 24/33  brain]
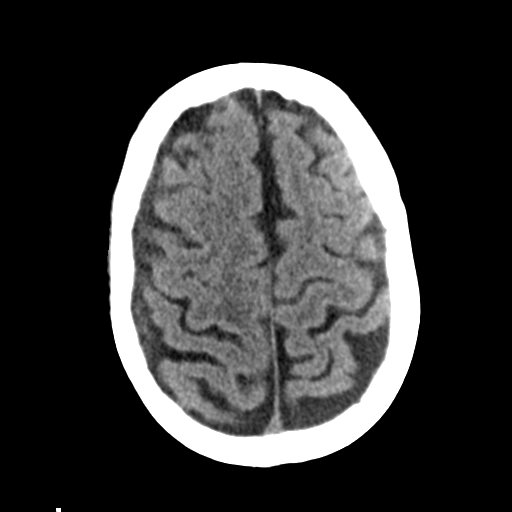
[im 27/33  brain]
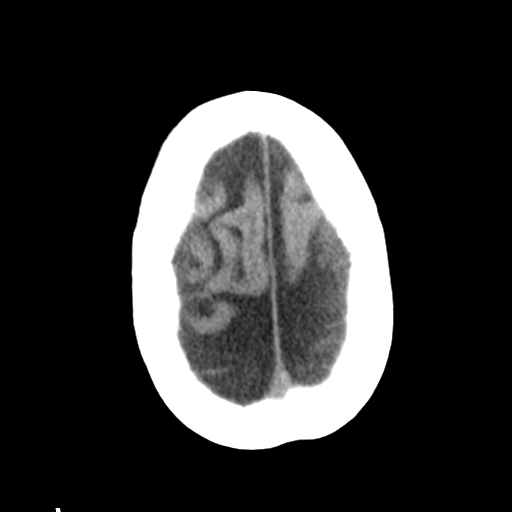
[im 30/33  brain]
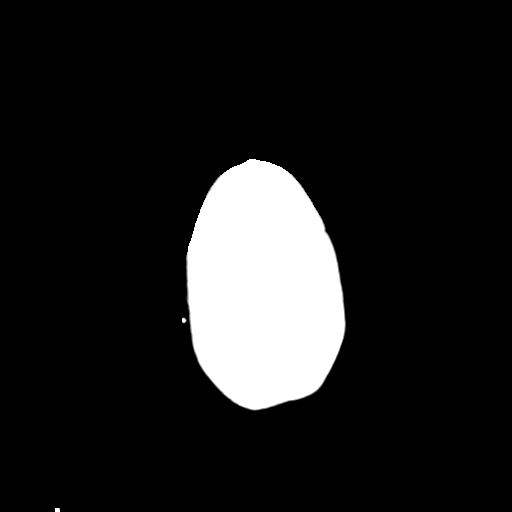
[im 30/33  bone]
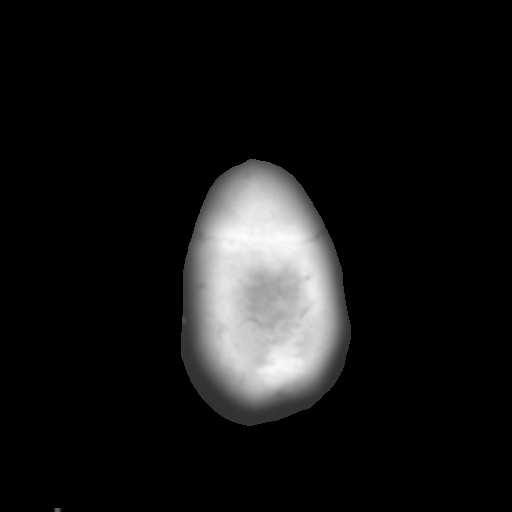

[Series 5: head 3.0 mpr cor · coronal · 0.36mm/px · 3 of 61 slices shown]
[im 21/61  brain]
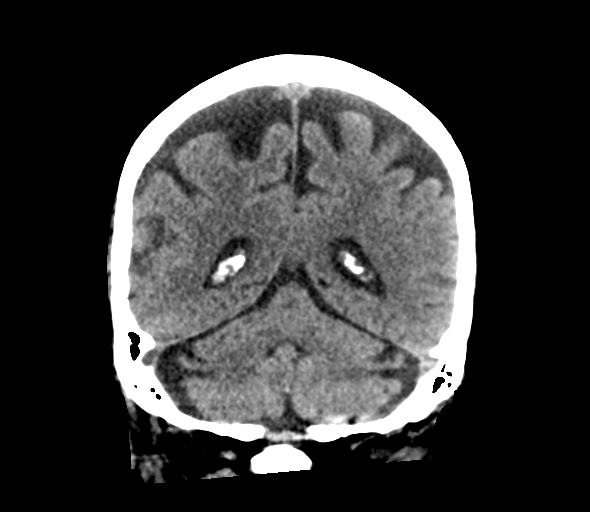
[im 27/61  brain]
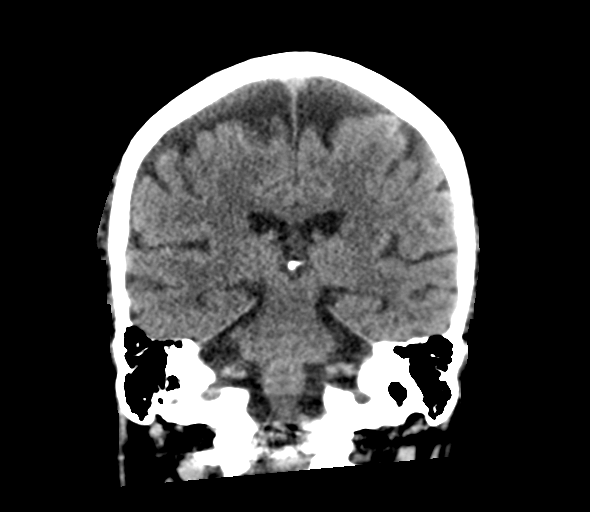
[im 34/61  brain]
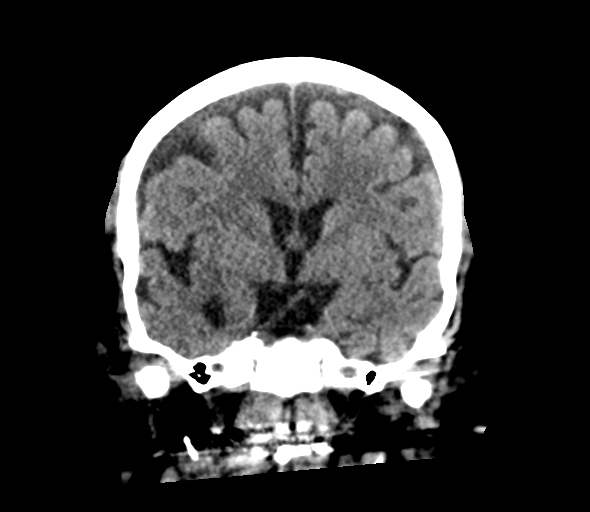

[Series 6: head 3.0 mpr sag · sagittal · 0.33mm/px · 3 of 49 slices shown]
[im 17/49  brain]
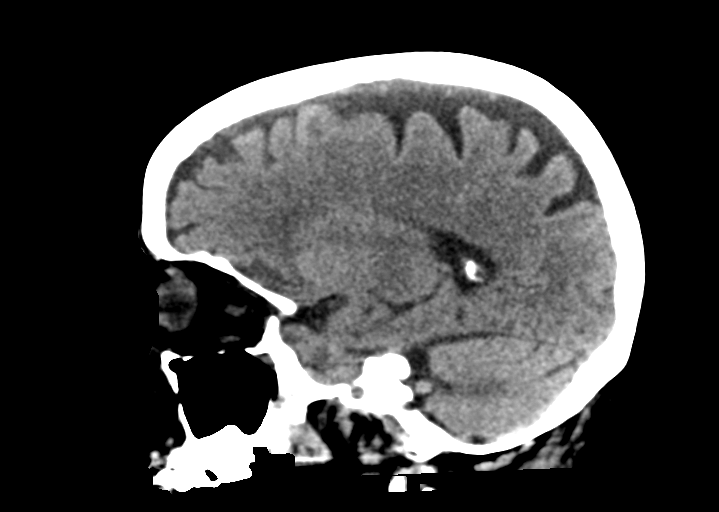
[im 25/49  brain]
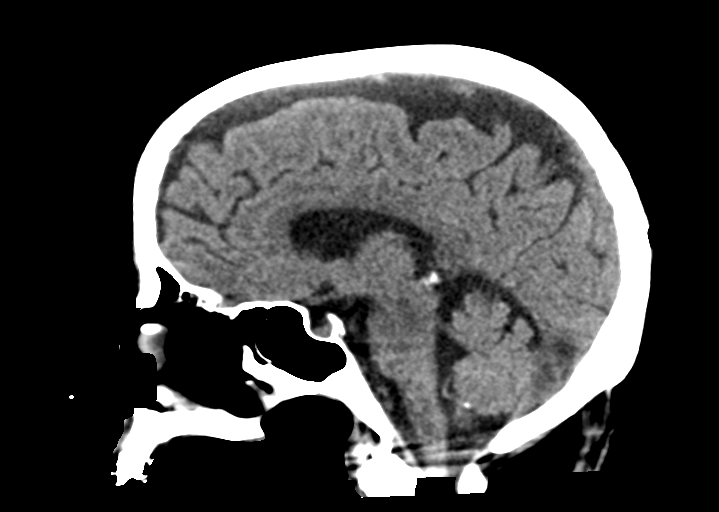
[im 33/49  brain]
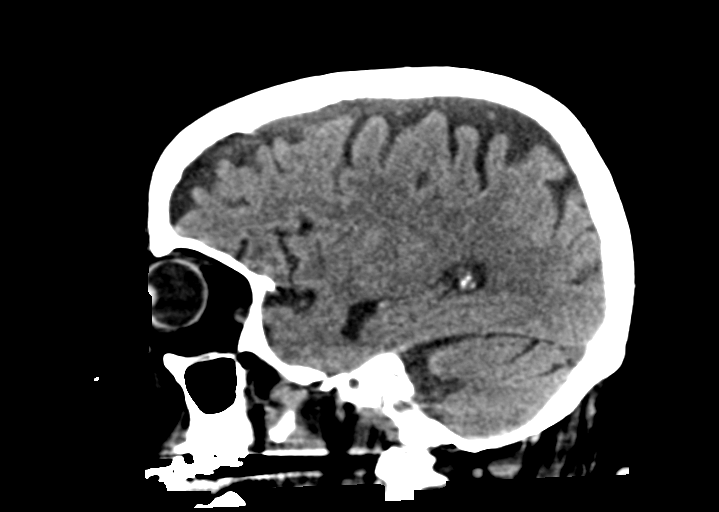

[15 of 47 positions shown; findings below may reference images not displayed]

FINDINGS: CT HEAD FINDINGS

Brain: Small low-density cortical area in the inferior right
occipital lobe since prior.

On coronal reformats there is question of a low-density subdural
collection displacing cortical veins on both sides of the cerebral
convexities, measuring up to 3 or 4 mm in thickness. No high-density
hemorrhage. No hydrocephalus.

Vascular: No hyperdense vessel or unexpected calcification.

Skull: Normal. Negative for fracture or focal lesion.

Sinuses/Orbits: No acute finding.

CT CERVICAL SPINE FINDINGS

Alignment: No traumatic malalignment

Skull base and vertebrae: No acute fracture

Soft tissues and spinal canal: No prevertebral fluid or swelling. No
visible canal hematoma.

Disc levels:  Ordinary cervical spine degeneration

Upper chest: No acute finding
IMPRESSION: 1. Small acute infarct in the right occipital cortex that has
occurred since 4 days prior.
2. Suspect trace bilateral subdural hygroma, attention on presumed
follow-up MRI.
3. Negative for cervical spine fracture.

## 2020-05-26 MED ORDER — ACETAMINOPHEN 650 MG RE SUPP: 650.0000 mg | Freq: Four times a day (QID) | RECTAL | Status: DC | PRN

## 2020-05-26 MED ORDER — TETANUS-DIPHTH-ACELL PERTUSSIS 5-2.5-18.5 LF-MCG/0.5 IM SUSY
0.5000 mL | PREFILLED_SYRINGE | Freq: Once | INTRAMUSCULAR | Status: AC
  Administered 2020-05-26: 0.5 mL via INTRAMUSCULAR
  Filled 2020-05-26: qty 0.5

## 2020-05-26 MED ORDER — MEMANTINE HCL ER 28 MG PO CP24
28.0000 mg | ORAL_CAPSULE | Freq: Every day | ORAL | Status: DC
  Administered 2020-05-26 – 2020-05-30 (×5): 28 mg via ORAL
  Filled 2020-05-26 (×6): qty 1

## 2020-05-26 MED ORDER — ATORVASTATIN CALCIUM 10 MG PO TABS: 10.0000 mg | ORAL_TABLET | Freq: Every day | ORAL | Status: DC

## 2020-05-26 MED ORDER — ATORVASTATIN CALCIUM 10 MG PO TABS
20.0000 mg | ORAL_TABLET | Freq: Every day | ORAL | Status: DC
  Administered 2020-05-26 – 2020-05-30 (×5): 20 mg via ORAL
  Filled 2020-05-26 (×5): qty 2

## 2020-05-26 MED ORDER — DONEPEZIL HCL 10 MG PO TABS
10.0000 mg | ORAL_TABLET | Freq: Every day | ORAL | Status: DC
  Administered 2020-05-26 – 2020-05-30 (×5): 10 mg via ORAL
  Filled 2020-05-26 (×5): qty 1

## 2020-05-26 MED ORDER — TRAZODONE HCL 50 MG PO TABS
50.0000 mg | ORAL_TABLET | Freq: Every day | ORAL | Status: DC
  Administered 2020-05-26 – 2020-05-30 (×5): 50 mg via ORAL
  Filled 2020-05-26 (×5): qty 1

## 2020-05-26 MED ORDER — BUSPIRONE HCL 5 MG PO TABS
5.0000 mg | ORAL_TABLET | Freq: Two times a day (BID) | ORAL | Status: DC
  Administered 2020-05-26 – 2020-05-31 (×9): 5 mg via ORAL
  Filled 2020-05-26 (×11): qty 1

## 2020-05-26 MED ORDER — MIRABEGRON ER 25 MG PO TB24
25.0000 mg | ORAL_TABLET | Freq: Every day | ORAL | Status: DC
  Administered 2020-05-27 – 2020-05-31 (×5): 25 mg via ORAL
  Filled 2020-05-26 (×5): qty 1

## 2020-05-26 MED ORDER — LIDOCAINE-EPINEPHRINE (PF) 2 %-1:200000 IJ SOLN
10.0000 mL | Freq: Once | INTRAMUSCULAR | Status: AC
  Administered 2020-05-26: 10 mL
  Filled 2020-05-26: qty 20

## 2020-05-26 MED ORDER — LORAZEPAM 1 MG PO TABS: 0.5000 mg | ORAL_TABLET | Freq: Two times a day (BID) | ORAL | Status: DC | PRN

## 2020-05-26 MED ORDER — SODIUM CHLORIDE 0.9% FLUSH
3.0000 mL | Freq: Two times a day (BID) | INTRAVENOUS | Status: DC
  Administered 2020-05-26 – 2020-05-31 (×10): 3 mL via INTRAVENOUS

## 2020-05-26 MED ORDER — ACETAMINOPHEN 325 MG PO TABS: 650.0000 mg | ORAL_TABLET | Freq: Four times a day (QID) | ORAL | Status: DC | PRN

## 2020-05-26 MED ORDER — VENLAFAXINE HCL ER 150 MG PO CP24
150.0000 mg | ORAL_CAPSULE | Freq: Every day | ORAL | Status: DC
  Administered 2020-05-27 – 2020-05-31 (×5): 150 mg via ORAL
  Filled 2020-05-26 (×6): qty 1

## 2020-05-26 MED ORDER — LORAZEPAM 0.5 MG PO TABS
0.5000 mg | ORAL_TABLET | Freq: Two times a day (BID) | ORAL | Status: DC
  Administered 2020-05-26 – 2020-05-31 (×9): 0.5 mg via ORAL
  Filled 2020-05-26 (×9): qty 1

## 2020-05-26 MED ORDER — SODIUM CHLORIDE 0.9 % IV SOLN: 10.0000 mL/h | Freq: Once | INTRAVENOUS | Status: DC

## 2020-05-26 NOTE — Progress Notes (Signed)
Rn arrived while pt waited to be taken back to ED to disconnect.

## 2020-05-26 NOTE — Procedures (Signed)
Came bedside for echo, but patient is in MRI, and is being transferred to room on unit after.

## 2020-05-26 NOTE — ED Provider Notes (Addendum)
[facility] EMERGENCY DEPARTMENT Provider Note   CSN: [id number] Arrival date & time: 05/26/20  1008     History Chief Complaint  Patient presents with  . Fall    Leslie Rios is a 80 y.o. female.  HPI   Patient presented to the ED for evaluation of a head injury.  Patient resides at an assisted living facility in [place].  She was trying to get out of a chair when she stumbled and fell.  Patient is not sure if she lost consciousness but the EMS report is that she did not.  Patient does have dementia and the patient states she is not able to remember why she is actually fell today.  Patient states she is not having any complaints right now.  She does have some bruising around her eyes which she thinks was from a previous fall and not from today.  She is not having any trouble with it a headache or neck pain.  No chest pain or shortness of breath.  No numbness or weakness.  Past Medical History:  Diagnosis Date  . Alzheimer disease ([place])   . Cystitis   . GERD (gastroesophageal reflux disease)   . Hypo-osmolality and hyponatremia   . Renal disorder     There are no problems to display for this patient.   History reviewed. No pertinent surgical history.   OB History   No obstetric history on file.     History reviewed. No pertinent family history.     Home Medications Prior to Admission medications   Not on File    Allergies    Sulfa antibiotics and Sulfamethizole  Review of Systems   Review of Systems  All other systems reviewed and are negative.   Physical Exam Updated Vital Signs BP (!) 142/54   Pulse 86   Temp 97.7 F (36.5 C) (Oral)   Resp (!) 21   SpO2 100%   Physical Exam Vitals and nursing note reviewed.  Constitutional:      General: She is not in acute distress.    Appearance: She is well-developed.  HENT:     Head: Normocephalic.     Comments: Palpable laceration posterior occiput, dried blood noted on the hair     Right Ear: External ear normal.     Left Ear: External ear normal.  Eyes:     General: No scleral icterus.       Right eye: No discharge.        Left eye: No discharge.     Conjunctiva/sclera: Conjunctivae normal.  Neck:     Trachea: No tracheal deviation.  Cardiovascular:     Rate and Rhythm: Normal rate and regular rhythm.  Pulmonary:     Effort: Pulmonary effort is normal. No respiratory distress.     Breath sounds: Normal breath sounds. No stridor. No wheezing or rales.  Abdominal:     General: Bowel sounds are normal. There is no distension.     Palpations: Abdomen is soft.     Tenderness: There is no abdominal tenderness. There is no guarding or rebound.  Genitourinary:    Comments: Brown stool Musculoskeletal:        General: No tenderness.     Cervical back: Neck supple.  Skin:    General: Skin is warm and dry.     Coloration: Skin is pale.     Findings: No rash.  Neurological:     General: No focal deficit present.  Mental Status: She is alert.     Cranial Nerves: No cranial nerve deficit (no facial droop, extraocular movements intact, no slurred speech).     Sensory: No sensory deficit.     Motor: No abnormal muscle tone or seizure activity.     Coordination: Coordination normal.     Comments: Able lift both legs off the bed, equal grip strength bilaterally     ED Results / Procedures / Treatments   Labs (all labs ordered are listed, but only abnormal results are displayed) Labs Reviewed  CBC - Abnormal; Notable for the following components:      Result Value   RBC 3.10 (*)    Hemoglobin 4.6 (*)    HCT 18.4 (*)    MCV 59.4 (*)    MCH 14.8 (*)    MCHC 25.0 (*)    RDW 24.0 (*)    nRBC 0.9 (*)    All other components within normal limits  BASIC METABOLIC PANEL - Abnormal; Notable for the following components:   Potassium 3.3 (*)    Glucose, Bld 136 (*)    Creatinine, Ser 1.02 (*)    Calcium 8.8 (*)    GFR, Estimated 56 (*)    All other components  within normal limits  POC OCCULT BLOOD, ED - Abnormal; Notable for the following components:   Fecal Occult Bld POSITIVE (*)    All other components within normal limits  CBC  TYPE AND SCREEN  PREPARE RBC (CROSSMATCH)    EKG EKG Interpretation  Date/Time:  Friday May 26 2020 10:23:01 EDT Ventricular Rate:  85 PR Interval:  149 QRS Duration: 112 QT Interval:  463 QTC Calculation: 551 R Axis:   -15 Text Interpretation: Sinus or ectopic atrial rhythm Ventricular premature complex Probable left atrial enlargement Borderline intraventricular conduction delay Nonspecific T abnormalities, lateral leads Prolonged QT interval No old tracing to compare Confirmed by Dorie Rank [phone]) on 05/26/2020 10:36:11 AM   Radiology CT Head Wo Contrast  Result Date: 05/26/2020 CLINICAL DATA:  Minor head trauma.  Laceration to back of head. EXAM: CT HEAD WITHOUT CONTRAST CT CERVICAL SPINE WITHOUT CONTRAST TECHNIQUE: Multidetector CT imaging of the head and cervical spine was performed following the standard protocol without intravenous contrast. Multiplanar CT image reconstructions of the cervical spine were also generated. COMPARISON:  05/22/2020 FINDINGS: CT HEAD FINDINGS Brain: Small low-density cortical area in the inferior right occipital lobe since prior. On coronal reformats there is question of a low-density subdural collection displacing cortical veins on both sides of the cerebral convexities, measuring up to 3 or 4 mm in thickness. No high-density hemorrhage. No hydrocephalus. Vascular: No hyperdense vessel or unexpected calcification. Skull: Normal. Negative for fracture or focal lesion. Sinuses/Orbits: No acute finding. CT CERVICAL SPINE FINDINGS Alignment: No traumatic malalignment Skull base and vertebrae: No acute fracture Soft tissues and spinal canal: No prevertebral fluid or swelling. No visible canal hematoma. Disc levels:  Ordinary cervical spine degeneration Upper chest: No acute finding  IMPRESSION: 1. Small acute infarct in the right occipital cortex that has occurred since 4 days prior. 2. Suspect trace bilateral subdural hygroma, attention on presumed follow-up MRI. 3. Negative for cervical spine fracture. Electronically Signed   By: Monte Fantasia M.D.   On: 05/26/2020 12:13   CT Cervical Spine Wo Contrast  Result Date: 05/26/2020 CLINICAL DATA:  Minor head trauma.  Laceration to back of head. EXAM: CT HEAD WITHOUT CONTRAST CT CERVICAL SPINE WITHOUT CONTRAST TECHNIQUE: Multidetector CT imaging of the  head and cervical spine was performed following the standard protocol without intravenous contrast. Multiplanar CT image reconstructions of the cervical spine were also generated. COMPARISON:  05/22/2020 FINDINGS: CT HEAD FINDINGS Brain: Small low-density cortical area in the inferior right occipital lobe since prior. On coronal reformats there is question of a low-density subdural collection displacing cortical veins on both sides of the cerebral convexities, measuring up to 3 or 4 mm in thickness. No high-density hemorrhage. No hydrocephalus. Vascular: No hyperdense vessel or unexpected calcification. Skull: Normal. Negative for fracture or focal lesion. Sinuses/Orbits: No acute finding. CT CERVICAL SPINE FINDINGS Alignment: No traumatic malalignment Skull base and vertebrae: No acute fracture Soft tissues and spinal canal: No prevertebral fluid or swelling. No visible canal hematoma. Disc levels:  Ordinary cervical spine degeneration Upper chest: No acute finding IMPRESSION: 1. Small acute infarct in the right occipital cortex that has occurred since 4 days prior. 2. Suspect trace bilateral subdural hygroma, attention on presumed follow-up MRI. 3. Negative for cervical spine fracture. Electronically Signed   By: Monte Fantasia M.D.   On: 05/26/2020 12:13    Procedures .Critical Care Performed by: Dorie Rank, MD Authorized by: Dorie Rank, MD   Critical care provider statement:     Critical care time (minutes):  45   Critical care was time spent personally by me on the following activities:  Discussions with consultants, evaluation of patient's response to treatment, examination of patient, ordering and performing treatments and interventions, ordering and review of laboratory studies, ordering and review of radiographic studies, pulse oximetry, re-evaluation of patient's condition, obtaining history from patient or surrogate and review of old charts     Medications Ordered in ED Medications  0.9 %  sodium chloride infusion (has no administration in time range)  Tdap (BOOSTRIX) injection 0.5 mL (0.5 mLs Intramuscular Given 05/26/20 1102)  lidocaine-EPINEPHrine (XYLOCAINE W/EPI) 2 %-1:200000 (PF) injection 10 mL (10 mLs Infiltration Given 05/26/20 1103)    ED Course  I have reviewed the triage vital signs and the nursing notes.  Pertinent labs & imaging results that were available during my care of the patient were reviewed by me and considered in my medical decision making (see chart for details).  Clinical Course as of 05/26/20 1343  Fri May 26, 2020  1217 Labs show a hemoglobin of 4.6 platelets and white count are normal. [JK]  1218 No old labs for comparison.  Will send off type and screen and repeat [JK]    Clinical Course User Index [JK] Dorie Rank, MD   MDM Rules/Calculators/A&P                          Patient presented to the ED for evaluation of a fall.  Apparently the patient has been somewhat weak recently and has fallen a few times.  On exam patient did have a scalp laceration.  This was treated with staple wound closure.  Patient's laboratory tests however do show profound anemia with a hemoglobin of 4.6.  Her fecal occult was positive.  Patient son was able to provide additional history and he indicated that this has happened to the patient the past.  They never could figure out where she was bleeding from.  I have ordered repeat CBC to confirm I will  order a blood transfusion.  I will consult the medical service and GI for admission.  Patient has remained hemodynamically stable in the ED  Head ct also shows possible acute stroke.  Outside of any treatment window.   Final Clinical Impression(s) / ED Diagnoses Final diagnoses:  Fall, initial encounter  Blood loss anemia  Gastrointestinal hemorrhage, unspecified gastrointestinal hemorrhage type  Laceration of scalp, initial encounter  Cerebrovascular accident (CVA), unspecified mechanism ([place])         Dorie Rank, MD 05/26/20 1343

## 2020-05-26 NOTE — ED Triage Notes (Signed)
Pt from [facility] assisted living facility in [place], had fall while trying to get out of chair. Lac to back of head, bleeding controlled. No LOC, no blood thinners. Hx of dementia. Bruising around L eye from fall a few days ago, for which she was already evaluated. Pt placed on 3L O2 by EMS for SpO2 88%, although pt denies shob.

## 2020-05-26 NOTE — H&P (Addendum)
Date: 05/26/2020               Patient Name:  Leslie Rios MRN: [id number]  DOB: 1940-06-11 Age / Sex: 79 y.o., female   PCP: Pcp, No         Medical Service: Internal Medicine Teaching Service         Attending Physician: Dr. Velna Ochs, MD    First Contact: Dr. Foy Guadalajara, MD Pager: [phone]  Second Contact: Dr. Jose Persia, MD Pager: [phone]       After Hours (After 5p/  First Contact Pager: [phone]  weekends / holidays): Second Contact Pager: [phone]   Chief Complaint: Fall  History of Present Illness: Leslie Rios is a 80 year old woman with past medical history significant for alzheimer disease, iron deficiency anemia due to unknown etiology, GERD in setting of hiatal hernia and HLD who presented to [facility] on 05/26/20 following a fall at home.  History obtained via chart review, patient and patient's son at bedside. Son states that on Monday, she fell out of her chair in the room of her ALF, however the fall was not witnessed. She did hit the back of her head and developed bilateral eye bruises. She went to an ED in [place], where she had a CT scan which was reportedly unremarkable. Today, she had an additional fall while eating breakfast. When her son asked about symptoms, patient states she does not remember any surrounding the episode. At this time, Leslie Rios states she is having some head pain where she hit her head today, but denies any other symptoms. She denies any vision changes, extremity weakness, chest pain, SOB, urinary symptoms, dizziness. She denies noticing any bright red or dark stool.   Of note, patient has a long history of multifactorial anemia. She has followed closely with gastroenterology with last office visit in September 2021. Patient has underwent upper endoscopy with small bowel enteroscopy in August 2021 which was unremarkable for any active bleeding or high stigmata for bleeding, although previous endoscopy did reveal Cameron erosions in the  setting of a large hiatal hernia. Patient's colonoscopy in 2019 was also negative. She has also undergone CT angiogram previously which was negative for a bleeding source. Patient was planned to obtain small bowel capsule, however awaiting insurance approval. Patient has followed closely with hematology/oncology as well with last office visit in September 2021. She has received multiple past iron infusions and prescribed oral iron supplementation.  ED Course: On arrival to the ED, patient mildly hypertensive (155/58) but otherwise hemodynamically stable, saturating well on room. Initial labs revealed CBC with hemoglobin of 4.6, MCV 59.4, RDW 24.0; occult blood positive; BMP with potassium of 3.3, glucose 136, creatinine 1.02. Patient underwent non-contrast CT Head revealed small acute infarct in the right occipital cortex and trace bilateral subdural hygroma with recommendation for follow-up MRI. CT Cervical spine unremarkable for a spinal fracture. Patient received Tdap injection and two units of packed red blood cells. Gastroenterology has been consulted by ED and Internal Medicine Teaching Service was consulted for admission.  Medications: Acetaminophen 650mg  Q6H PRN Atorvastatin 20mg  daily Buspirone 5mg  twice daily Furosemide 20mg  daily Lorazepam 0.5mg  twice daily Mybetriq 25mg  daily Namzaric (memantine and donepezil) 28-10mg  nightly Hydrocodone-acetaminophen 5-325mg  Q6H PRN Ondansetron 4mg  Q6H PRN Pantoprazole 40mg  daily Trazodone 50mg  nightly Venlafaxine 150mg  daily  Allergies: Allergies as of 05/26/2020 - Review Complete 05/26/2020  Allergen Reaction Noted  . Sulfa antibiotics Rash 11/22/2011  . Sulfamethizole  05/26/2020   Past  Medical History:  Diagnosis Date  . Alzheimer disease ([place])   . Cystitis   . GERD (gastroesophageal reflux disease)   . Hypo-osmolality and hyponatremia   . Renal disorder    Family History: Mother - osteoporosis, dementia Father - hypertension,  nephrolithiasis Paternal grandmother - stroke  Social History: Patient lives in [place] [facility]. She is widowed and has two children. She does not currently use tobacco, alcohol or drugs. She ambulates with the use of a walker when out of her room but attempts to ambulate independently within her room.  Review of Systems: A complete ROS was negative except as per HPI.  Physical Exam: Blood pressure 123/65, pulse 83, temperature 97.7 F (36.5 C), temperature source Oral, resp. rate 19, SpO2 99 %. Physical Exam Vitals reviewed.  Constitutional:      General: She is not in acute distress.    Comments: Comfortable-appearing, elderly woman, supine in ED stretcher in no acute distress  HENT:     Head:     Comments: Patient has periorbital bruising around right eye Eyes:     Extraocular Movements: Extraocular movements intact.     Conjunctiva/sclera: Conjunctivae normal.     Pupils: Pupils are equal, round, and reactive to light.  Cardiovascular:     Rate and Rhythm: Normal rate and regular rhythm.     Pulses: Normal pulses.     Heart sounds: Normal heart sounds.  Pulmonary:     Effort: Pulmonary effort is normal. No respiratory distress.     Breath sounds: Normal breath sounds.  Abdominal:     General: Abdomen is flat. Bowel sounds are normal.     Palpations: Abdomen is soft.     Tenderness: There is no abdominal tenderness.  Musculoskeletal:        General: Normal range of motion.     Right lower leg: Edema present.     Left lower leg: Edema present.     Comments: Trace edema of the bilateral lower extremities.  Skin:    Capillary Refill: Capillary refill takes less than 2 seconds.     Coloration: Skin is pale.     Findings: Bruising present.  Neurological:     General: No focal deficit present.     Mental Status: She is alert. Mental status is at baseline.     Cranial Nerves: No cranial nerve deficit.     Sensory: No sensory deficit.      Motor: No weakness.     Coordination: Coordination normal.  Psychiatric:        Mood and Affect: Mood normal.        Behavior: Behavior normal.    EKG: personally reviewed my interpretation is sinus or ectopic atrial rhythm with ventricular premature complexes, possible left atrial enlargement, borderline intraventricular conduction delay, non-specific T abnormalities in lateral leads and prolonged QT interval.  Assessment & Plan by Problem: Active Problems:   Symptomatic anemia  Leslie Rios is a 80 year old woman with past medical history significant for alzheimer disease, iron deficiency anemia due to unknown etiology, GERD in setting of hiatal hernia and HLD who presented to [facility] on 05/26/20 following a fall at home found to have significant acute on chronic microcytic anemia as well as a right occipital cortical CVA.  #Acute on chronic iron deficiency anemia #Hiatal hernia Patient's hemoglobin noted to be 4.6 with MCV of 59.4. Patient has had extensive workup with gastroenterology in the past due to recurrence of this anemia with no  clear cause. Patient does have a large hiatal hernia with prior history of Cameron erosions which could be the cause of further bleeding. Patient has no alternative source of bleeding on examination. Patient would benefit from gastroenterology evaluation. -Gastroenterology following, appreciate recommendations -Transfuse two units of packed red blood cells now -CBC -Iron, TIBC and ferritin  #Right occipital cortex CVA, active #Bilateral trace subdural hygroma, active Patient does not have any neurologic deficits on examination although her CT Head does reveal a new right occipital cortical CVA. Patient's vision is intact. Unclear cause of patient's new stroke, although her anemia surely may have led to hypoperfusion particularly if she had an area of pre-existing significant stenosis. -Neurology consulted, appreciate recommendations -Echocardiogram  ordered -NPO  #HLD, chronic -Continue home atorvastatin -Lipid panel  #Alzheimer's disease, chronic -Continue home memantine and donepezil  #Anxiety, chronic -Continue home lorazepam 0.5mg  twice daily -Continue home trazodone 50mg  daily -Continue home buspirone 5mg  twice daily  #VTE ppx: SCDs #IVF: None #Code status: Full code #Bowel regimen: None  Dispo: Admit patient to Inpatient with expected length of stay greater than 2 midnights.  Signed: Cato Mulligan, MD 05/26/2020, 3:15 PM  Pager: [phone] After 5pm on weekdays and 1pm on weekends: On Call pager: [phone]

## 2020-05-26 NOTE — ED Notes (Signed)
Dinner ordered [employer]

## 2020-05-26 NOTE — ED Notes (Signed)
To MRI [id number]

## 2020-05-26 NOTE — Consult Note (Addendum)
Referring Provider: No ref. provider found Primary Care Physician:  Pcp, No Primary Gastroenterologist:  Althia Forts, previously seen by Dr. Sunny Schlein through [facility]  Reason for Consultation:  Anemia and heme positive stool  HPI: Leslie Rios is a 80 y.o. female with past medical history of Alzheimer's disease and recurrent anemia.  Was brought to [facility] today after falling at her assisted living facility in [place].  She was found to have a hemoglobin of 4.6 g.  Hemoccult positive.  BUN is normal at 12.  CT head here shows a small acute infarct that has occurred since 4 days ago (apparently had a fall 4 days ago and was taken to [facility] where CT was performed).  Unsure if labs were performed at [facility] and what Hgb was at that time.  She has history of anemia and GI bleed and has been followed through [facility].  Last colonoscopy while hospitalized in August 2019 showed a tortuous angulated distal sigmoid with sigmoid diverticulosis and no obvious polyp or lesion. The upper endoscopy at that time showed large sliding hiatal hernia with clean-based Cameron's erosions which were thought to be the source of her anemia at that time.  Was hospitalized in 09/2019 for Hgb of 4.7 grams but no overt bleeding.  Push enteroscopy on 09/23/2019 was unremarkable for any active bleeding or high stigmata for bleeding. At the time she was not overtly bleeding and it was recommended she continue oral iron supplementation after hospital discharge and follow-up as an outpatient.  She has been followed by hematology and has received IV iron infusions with good response in the past.  Her last GI follow-up looks like it was 10/2019 at which time they discussed VCE but it does not look like that was ever performed.  History obtained from the patient and her son who is at bedside.  No overt sign of GI bleeding with black or bloody stools.  No abdominal pain. No blood thinners or NSAIDs.  Appetite has been  good.  Past Medical History:  Diagnosis Date  . Alzheimer disease ([place])   . Cystitis   . GERD (gastroesophageal reflux disease)   . Hypo-osmolality and hyponatremia   . Renal disorder     History reviewed. No pertinent surgical history.  Prior to Admission medications   Not on File    Current Facility-Administered Medications  Medication Dose Route Frequency Provider Last Rate Last Admin  . 0.9 %  sodium chloride infusion  10 mL/hr Intravenous Once Dorie Rank, MD       No current outpatient medications on file.    Allergies as of 05/26/2020 - Review Complete 05/26/2020  Allergen Reaction Noted  . Sulfa antibiotics Rash 11/22/2011  . Sulfamethizole  05/26/2020    History reviewed. No pertinent family history.  Social History   Socioeconomic History  . Marital status: Widowed    Spouse name: Not on file  . Number of children: Not on file  . Years of education: Not on file  . Highest education level: Not on file  Occupational History  . Not on file  Tobacco Use  . Smoking status: Not on file  . Smokeless tobacco: Not on file  Substance and Sexual Activity  . Alcohol use: Not on file  . Drug use: Not on file  . Sexual activity: Not on file  Other Topics Concern  . Not on file  Social History Narrative  . Not on file   Social Determinants of Health   Financial  Resource Strain: Not on file  Food Insecurity: Not on file  Transportation Needs: Not on file  Physical Activity: Not on file  Stress: Not on file  Social Connections: Not on file  Intimate Partner Violence: Not on file    Review of Systems: ROS is O/W negative except as mentioned in HPI.  Physical Exam: Vital signs in last 24 hours: Temp:  [97.7 F (36.5 C)] 97.7 F (36.5 C) (04/08 1020) Pulse Rate:  [80-89] 86 (04/08 1300) Resp:  [16-22] 21 (04/08 1300) BP: (118-155)/(49-83) 142/54 (04/08 1300) SpO2:  [90 %-100 %] 100 % (04/08 1300)   General:  Alert, elderly, pleasant and cooperative  in NAD Head:  Normocephalic and atraumatic.  Eyes:  Sclera clear, no icterus.  Conjunctiva pink.  Peri-orbital bruising noted. Ears:  Normal auditory acuity. Mouth:  No deformity or lesions.   Lungs:  Clear throughout to auscultation.  No wheezes, crackles, or rhonchi.  Heart:  Regular rate and rhythm; no murmurs, clicks, rubs, or gallops. Abdomen:  Soft, non-distended.  BS present.  Non-tender. Rectal:  Deferred.  Heme positive in the ED.  Msk:  Symmetrical without gross deformities. Pulses:  Normal pulses noted. Extremities:  Without clubbing or edema. Neurologic:  Alert and oriented x 4;  grossly normal neurologically. Skin:  Intact without significant lesions or rashes. Psych:  Alert and cooperative. Normal mood and affect.  Lab Results: Recent Labs    05/26/20 1020  WBC 9.3  HGB 4.6*  HCT 18.4*  PLT 274   BMET Recent Labs    05/26/20 1020  NA 135  K 3.3*  CL 101  CO2 24  GLUCOSE 136*  BUN 12  CREATININE 1.02*  CALCIUM 8.8*   Studies/Results: CT Head Wo Contrast  Result Date: 05/26/2020 CLINICAL DATA:  Minor head trauma.  Laceration to back of head. EXAM: CT HEAD WITHOUT CONTRAST CT CERVICAL SPINE WITHOUT CONTRAST TECHNIQUE: Multidetector CT imaging of the head and cervical spine was performed following the standard protocol without intravenous contrast. Multiplanar CT image reconstructions of the cervical spine were also generated. COMPARISON:  05/22/2020 FINDINGS: CT HEAD FINDINGS Brain: Small low-density cortical area in the inferior right occipital lobe since prior. On coronal reformats there is question of a low-density subdural collection displacing cortical veins on both sides of the cerebral convexities, measuring up to 3 or 4 mm in thickness. No high-density hemorrhage. No hydrocephalus. Vascular: No hyperdense vessel or unexpected calcification. Skull: Normal. Negative for fracture or focal lesion. Sinuses/Orbits: No acute finding. CT CERVICAL SPINE FINDINGS  Alignment: No traumatic malalignment Skull base and vertebrae: No acute fracture Soft tissues and spinal canal: No prevertebral fluid or swelling. No visible canal hematoma. Disc levels:  Ordinary cervical spine degeneration Upper chest: No acute finding IMPRESSION: 1. Small acute infarct in the right occipital cortex that has occurred since 4 days prior. 2. Suspect trace bilateral subdural hygroma, attention on presumed follow-up MRI. 3. Negative for cervical spine fracture. Electronically Signed   By: Monte Fantasia M.D.   On: 05/26/2020 12:13   CT Cervical Spine Wo Contrast  Result Date: 05/26/2020 CLINICAL DATA:  Minor head trauma.  Laceration to back of head. EXAM: CT HEAD WITHOUT CONTRAST CT CERVICAL SPINE WITHOUT CONTRAST TECHNIQUE: Multidetector CT imaging of the head and cervical spine was performed following the standard protocol without intravenous contrast. Multiplanar CT image reconstructions of the cervical spine were also generated. COMPARISON:  05/22/2020 FINDINGS: CT HEAD FINDINGS Brain: Small low-density cortical area in the inferior right occipital  lobe since prior. On coronal reformats there is question of a low-density subdural collection displacing cortical veins on both sides of the cerebral convexities, measuring up to 3 or 4 mm in thickness. No high-density hemorrhage. No hydrocephalus. Vascular: No hyperdense vessel or unexpected calcification. Skull: Normal. Negative for fracture or focal lesion. Sinuses/Orbits: No acute finding. CT CERVICAL SPINE FINDINGS Alignment: No traumatic malalignment Skull base and vertebrae: No acute fracture Soft tissues and spinal canal: No prevertebral fluid or swelling. No visible canal hematoma. Disc levels:  Ordinary cervical spine degeneration Upper chest: No acute finding IMPRESSION: 1. Small acute infarct in the right occipital cortex that has occurred since 4 days prior. 2. Suspect trace bilateral subdural hygroma, attention on presumed follow-up  MRI. 3. Negative for cervical spine fracture. Electronically Signed   By: Monte Fantasia M.D.   On: 05/26/2020 12:13   IMPRESSION:  *80 year old female with profound symptomatic anemia with hemoglobin of 4.6 g and heme positive stools.  It appears that this has been a recurrent issue and she previously followed with GI through [facility].  EGD and colonoscopy in August 2019 with only large hiatal hernia and Cameron erosions noted.  Enteroscopy August 2021 negative for any bleeding source.  It looks like she was going to get a video capsule endoscopy last fall, but it does not appear that was ever performed.  No overt sign of GI bleeding. -Alzheimer's dementia -Small acute CVA in the right occipital cortex  PLAN: -Transfuse and monitor Hgb. -Follow-up neuro consult. -Will likely plan for EGD to start but likely not until Sunday and our team will follow-up with her tomorrow to be sure it is safe to proceed.   Leslie Rios  05/26/2020, 1:50 PM    Attending physician's note   I have taken a history, examined the patient and reviewed the chart. I agree with the Advanced Practitioner's note, impression and recommendations.  80 year old very pleasant female with history of Alzheimer's disease admitted with severe symptomatic anemia. He has history of iron deficiency anemia, has had endoscopic evaluation in the past Possible etiology of recurrent anemia is chronic GI blood loss from [place] erosions  Transfuse to hemoglobin> 7 She is getting evaluated for possible stroke, MRI brain pending We will plan for EGD once she is cleared by neurology and hemoglobin is greater than 7  If EGD is unrevealing for any potential source of GI blood loss, plan for small bowel video capsule for further evaluation He is current with colorectal cancer screening, last colonoscopy in August 2019 with no polyps or mass lesion.  Soft diet as tolerated IV PPI twice daily N.p.o. after midnight  Dr.  Loletha Carrow will evaluate her tomorrow and decide on timing of EGD     The patient was provided an opportunity to ask questions and all were answered. The patient agreed with the plan and demonstrated an understanding of the instructions.  Damaris Hippo , MD [phone]

## 2020-05-26 NOTE — Progress Notes (Signed)
Pt arrived with transfusion in progress. RN contacted, said she would be here in about 5 mins.  Rn now unavailable, currently with another pt. Pt sent back to room since it has been over 15 mins. Will attempt later.

## 2020-05-26 NOTE — ED Notes (Signed)
Critical hemoglobin results given to ER MD and ER RN

## 2020-05-26 NOTE — ED Notes (Signed)
Assisted in rectal

## 2020-05-27 ENCOUNTER — Observation Stay (HOSPITAL_COMMUNITY): Payer: Medicare HMO

## 2020-05-27 DIAGNOSIS — S065X9A Traumatic subdural hemorrhage with loss of consciousness of unspecified duration, initial encounter: Secondary | ICD-10-CM | POA: Diagnosis not present

## 2020-05-27 DIAGNOSIS — I6389 Other cerebral infarction: Secondary | ICD-10-CM

## 2020-05-27 DIAGNOSIS — D500 Iron deficiency anemia secondary to blood loss (chronic): Secondary | ICD-10-CM | POA: Diagnosis not present

## 2020-05-27 DIAGNOSIS — K257 Chronic gastric ulcer without hemorrhage or perforation: Secondary | ICD-10-CM | POA: Diagnosis not present

## 2020-05-27 DIAGNOSIS — R0602 Shortness of breath: Secondary | ICD-10-CM | POA: Diagnosis not present

## 2020-05-27 DIAGNOSIS — J9811 Atelectasis: Secondary | ICD-10-CM | POA: Diagnosis not present

## 2020-05-27 DIAGNOSIS — J810 Acute pulmonary edema: Secondary | ICD-10-CM

## 2020-05-27 DIAGNOSIS — R0902 Hypoxemia: Secondary | ICD-10-CM | POA: Diagnosis not present

## 2020-05-27 DIAGNOSIS — K449 Diaphragmatic hernia without obstruction or gangrene: Secondary | ICD-10-CM

## 2020-05-27 DIAGNOSIS — D649 Anemia, unspecified: Secondary | ICD-10-CM | POA: Diagnosis not present

## 2020-05-27 DIAGNOSIS — I639 Cerebral infarction, unspecified: Secondary | ICD-10-CM

## 2020-05-27 DIAGNOSIS — J189 Pneumonia, unspecified organism: Secondary | ICD-10-CM | POA: Diagnosis not present

## 2020-05-27 LAB — BPAM RBC
Blood Product Expiration Date: 202204292359
Blood Product Expiration Date: 202205042359
ISSUE DATE / TIME: 202204081521
ISSUE DATE / TIME: 202204082032
Unit Type and Rh: 5100
Unit Type and Rh: 5100

## 2020-05-27 LAB — COMPREHENSIVE METABOLIC PANEL
ALT: 15 U/L (ref 0–44)
AST: 18 U/L (ref 15–41)
Albumin: 2.7 g/dL — ABNORMAL LOW (ref 3.5–5.0)
Alkaline Phosphatase: 88 U/L (ref 38–126)
Anion gap: 8 (ref 5–15)
BUN: 11 mg/dL (ref 8–23)
CO2: 24 mmol/L (ref 22–32)
Calcium: 8.6 mg/dL — ABNORMAL LOW (ref 8.9–10.3)
Chloride: 103 mmol/L (ref 98–111)
Creatinine, Ser: 0.87 mg/dL (ref 0.44–1.00)
GFR, Estimated: >60 mL/min (ref >60)
Glucose, Bld: 103 mg/dL — ABNORMAL HIGH (ref 70–99)
Potassium: 3.3 mmol/L — ABNORMAL LOW (ref 3.5–5.1)
Sodium: 135 mmol/L (ref 135–145)
Total Bilirubin: 1.3 mg/dL — ABNORMAL HIGH (ref 0.3–1.2)
Total Protein: 5.8 g/dL — ABNORMAL LOW (ref 6.5–8.1)

## 2020-05-27 LAB — TYPE AND SCREEN
ABO/RH(D): O POS
Antibody Screen: NEGATIVE
Unit division: 0
Unit division: 0

## 2020-05-27 LAB — ECHOCARDIOGRAM LIMITED
Area-P 1/2: 4.63 cm2
MV M vel: 5.78 m/s
MV Peak grad: 133.6 mmHg
Radius: 0.8 cm
S' Lateral: 2.7 cm
Weight: 2437.41 oz

## 2020-05-27 LAB — CBC
HCT: 25.6 % — ABNORMAL LOW (ref 36.0–46.0)
Hemoglobin: 7.5 g/dL — ABNORMAL LOW (ref 12.0–15.0)
MCH: 19.9 pg — ABNORMAL LOW (ref 26.0–34.0)
MCHC: 29.3 g/dL — ABNORMAL LOW (ref 30.0–36.0)
MCV: 67.9 fL — ABNORMAL LOW (ref 80.0–100.0)
Platelets: 191 10*3/uL (ref 150–400)
RBC: 3.77 MIL/uL — ABNORMAL LOW (ref 3.87–5.11)
RDW: 30.2 % — ABNORMAL HIGH (ref 11.5–15.5)
WBC: 11.8 10*3/uL — ABNORMAL HIGH (ref 4.0–10.5)
nRBC: 0.3 % — ABNORMAL HIGH (ref 0.0–0.2)

## 2020-05-27 LAB — HEMOGLOBIN AND HEMATOCRIT, BLOOD
HCT: 24.5 % — ABNORMAL LOW (ref 36.0–46.0)
Hemoglobin: 7.4 g/dL — ABNORMAL LOW (ref 12.0–15.0)

## 2020-05-27 IMAGING — DX DG CHEST 1V PORT
1 series · 1 of 1 positions shown · non-contrast
Comparison: None.

CLINICAL DATA: 79-year-old who sustained a fall at home. Acute
shortness of breath. Current history of iron deficiency anemia and
Alzheimer dementia.

EXAM:
PORTABLE CHEST 1 VIEW

[chest ap]
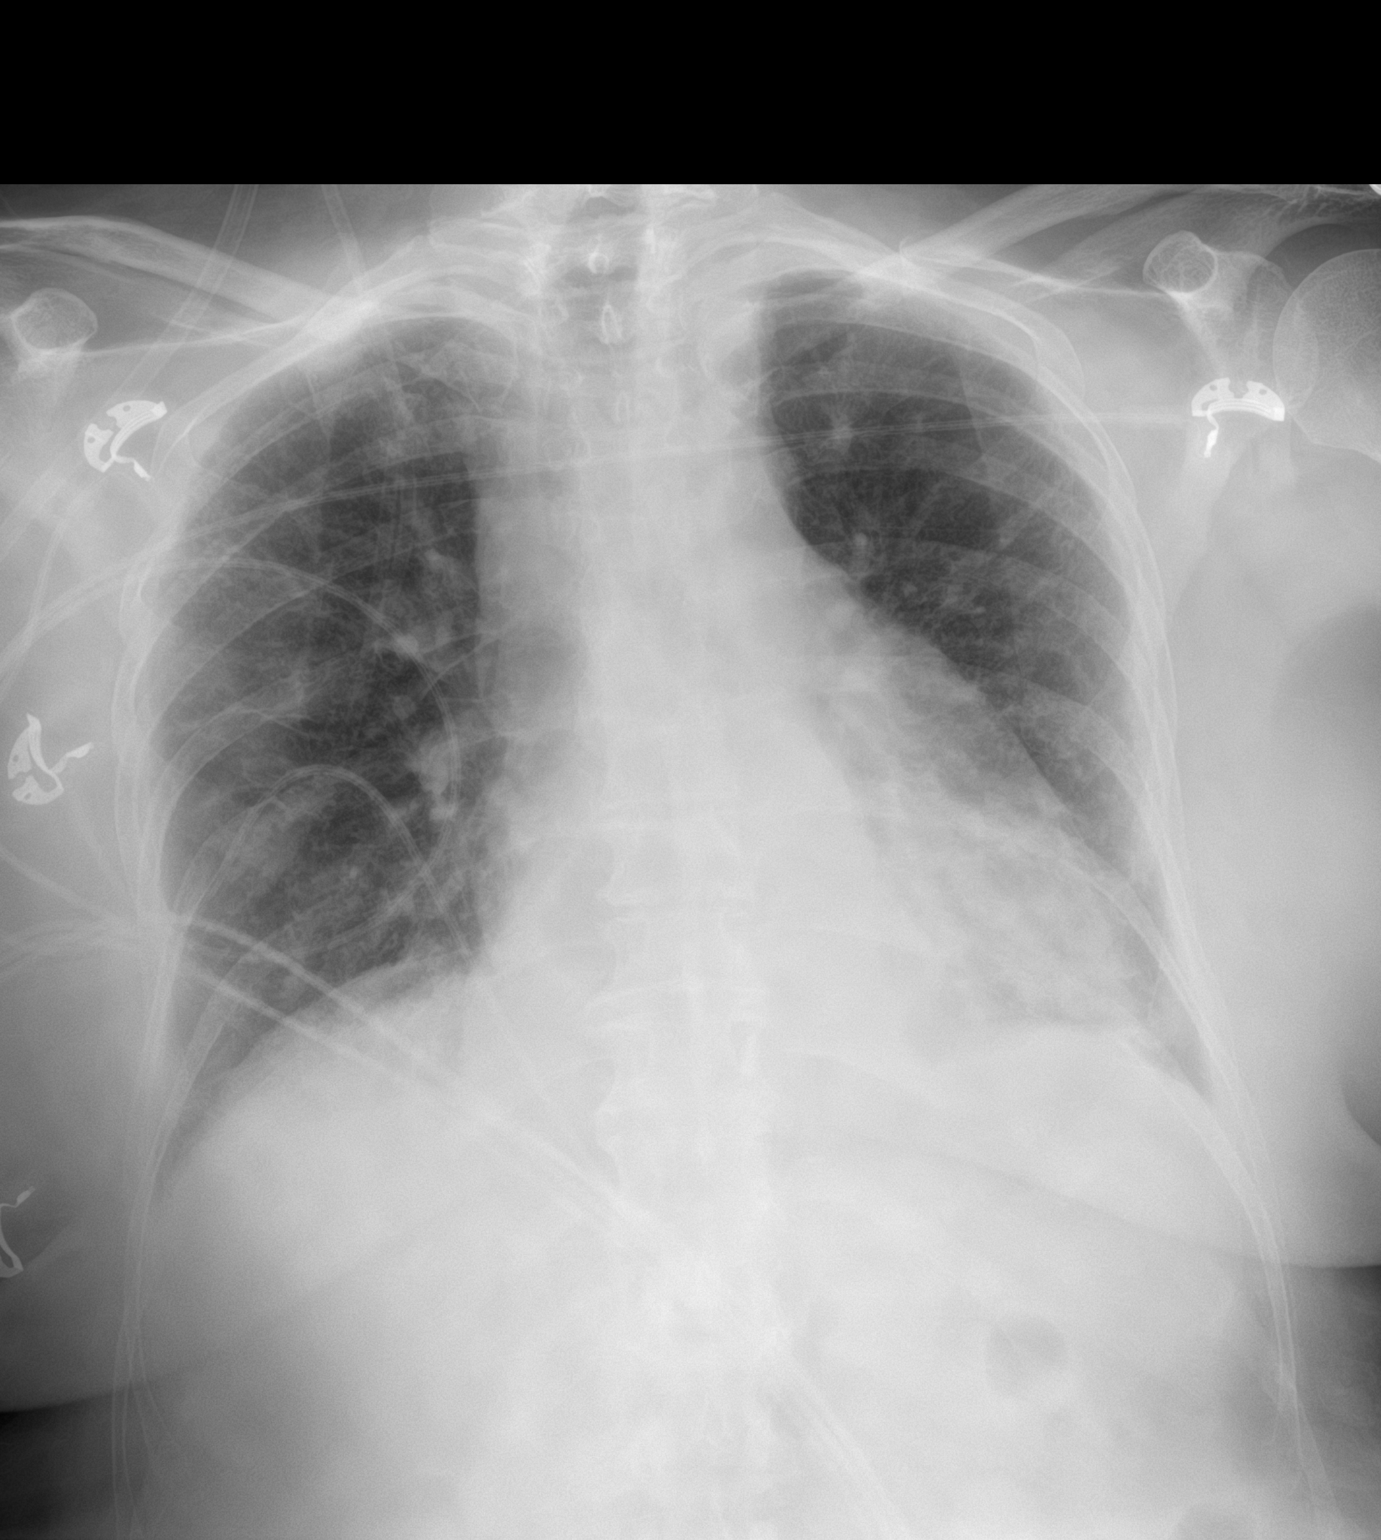

[1 of 1 positions shown; findings below may reference images not displayed]

FINDINGS: Cardiac silhouette upper normal in size to slightly enlarged for the
AP portable technique. Moderate-sized hiatal hernia. Hilar
mediastinal contours otherwise unremarkable. Pulmonary vascularity
normal. Patchy airspace opacities at the LEFT lung base. Lungs
otherwise clear. No visible pleural effusions.
IMPRESSION: 1. Pneumonia favored over atelectasis at the LEFT lung base.
2. Moderate-sized hiatal hernia.

## 2020-05-27 MED ORDER — POTASSIUM CHLORIDE 10 MEQ/100ML IV SOLN
10.0000 meq | INTRAVENOUS | Status: AC
  Administered 2020-05-27 (×6): 10 meq via INTRAVENOUS
  Filled 2020-05-27 (×6): qty 100

## 2020-05-27 MED ORDER — SODIUM CHLORIDE 0.9 % IV SOLN
510.0000 mg | Freq: Once | INTRAVENOUS | Status: AC
  Administered 2020-05-27: 510 mg via INTRAVENOUS
  Filled 2020-05-27: qty 17

## 2020-05-27 MED ORDER — POLYETHYLENE GLYCOL 3350 17 G PO PACK
17.0000 g | PACK | Freq: Every day | ORAL | Status: DC
  Administered 2020-05-29 – 2020-05-31 (×3): 17 g via ORAL
  Filled 2020-05-27 (×3): qty 1

## 2020-05-27 MED ORDER — PANTOPRAZOLE SODIUM 40 MG PO TBEC
40.0000 mg | DELAYED_RELEASE_TABLET | Freq: Every day | ORAL | Status: DC
  Administered 2020-05-27 – 2020-05-31 (×5): 40 mg via ORAL
  Filled 2020-05-27 (×5): qty 1

## 2020-05-27 NOTE — H&P (View-Only) (Signed)
Gastroenterology Progress Note  CC:  Anemia, + FOBT  Subjective: She denies having any nausea or vomiting.  No abdominal pain.  No chest pain or shortness of breath.  She tolerated a soft diet for breakfast.  She passed a normal soft formed brown bowel movement this morning, no rectal bleeding or melena as reported by her RN.    Objective:  Vital signs in last 24 hours: Temp:  [97.8 F (36.6 C)-99.7 F (37.6 C)] 97.8 F (36.6 C) (04/09 0443) Pulse Rate:  [50-97] 78 (04/09 0443) Resp:  [16-26] 18 (04/09 0443) BP: (101-151)/(46-82) 142/58 (04/09 0443) SpO2:  [85 %-100 %] 96 % (04/09 0443) Weight:  [69.1 kg] 69.1 kg (04/09 0443)   General:   Alert 80 year old female in NAD.  Heart: RRR, no murmur.  Pulm:  Breath sounds clear throughout.  Abdomen: Soft, nondistended.  Positive bowel sounds to all 4 quadrants Extremities:  Without edema. Neurologic:  Alert and  oriented x 3.  Speech is clear.  She moves all extremities. Psych:  Alert and cooperative. Normal mood and affect.  Intake/Output from previous day: 04/08 0701 - 04/09 0700 In: 1282.7 [I.V.:250; Blood:1032.7] Out: -  Intake/Output this shift: No intake/output data recorded.  Lab Results: Recent Labs    05/26/20 1020 05/26/20 1320 05/27/20 0319  WBC 9.3 8.4  --   HGB 4.6* 4.0* 7.4*  HCT 18.4* 16.4* 24.5*  PLT 274 238  --    BMET Recent Labs    05/26/20 1020 05/27/20 0319  NA 135 135  K 3.3* 3.3*  CL 101 103  CO2 24 24  GLUCOSE 136* 103*  BUN 12 11  CREATININE 1.02* 0.87  CALCIUM 8.8* 8.6*   LFT Recent Labs    05/27/20 0319  PROT 5.8*  ALBUMIN 2.7*  AST 18  ALT 15  ALKPHOS 88  BILITOT 1.3*   PT/INR No results for input(s): LABPROT, INR in the last 72 hours. Hepatitis Panel No results for input(s): HEPBSAG, HCVAB, HEPAIGM, HEPBIGM in the last 72 hours.  CT Head Wo Contrast  Result Date: 05/26/2020 CLINICAL DATA:  Minor head trauma.  Laceration to back of head. EXAM: CT HEAD  WITHOUT CONTRAST CT CERVICAL SPINE WITHOUT CONTRAST TECHNIQUE: Multidetector CT imaging of the head and cervical spine was performed following the standard protocol without intravenous contrast. Multiplanar CT image reconstructions of the cervical spine were also generated. COMPARISON:  05/22/2020 FINDINGS: CT HEAD FINDINGS Brain: Small low-density cortical area in the inferior right occipital lobe since prior. On coronal reformats there is question of a low-density subdural collection displacing cortical veins on both sides of the cerebral convexities, measuring up to 3 or 4 mm in thickness. No high-density hemorrhage. No hydrocephalus. Vascular: No hyperdense vessel or unexpected calcification. Skull: Normal. Negative for fracture or focal lesion. Sinuses/Orbits: No acute finding. CT CERVICAL SPINE FINDINGS Alignment: No traumatic malalignment Skull base and vertebrae: No acute fracture Soft tissues and spinal canal: No prevertebral fluid or swelling. No visible canal hematoma. Disc levels:  Ordinary cervical spine degeneration Upper chest: No acute finding IMPRESSION: 1. Small acute infarct in the right occipital cortex that has occurred since 4 days prior. 2. Suspect trace bilateral subdural hygroma, attention on presumed follow-up MRI. 3. Negative for cervical spine fracture. Electronically Signed   By: Monte Fantasia M.D.   On: 05/26/2020 12:13   CT Cervical Spine Wo Contrast  Result Date: 05/26/2020 CLINICAL DATA:  Minor head trauma.  Laceration to back  of head. EXAM: CT HEAD WITHOUT CONTRAST CT CERVICAL SPINE WITHOUT CONTRAST TECHNIQUE: Multidetector CT imaging of the head and cervical spine was performed following the standard protocol without intravenous contrast. Multiplanar CT image reconstructions of the cervical spine were also generated. COMPARISON:  05/22/2020 FINDINGS: CT HEAD FINDINGS Brain: Small low-density cortical area in the inferior right occipital lobe since prior. On coronal reformats  there is question of a low-density subdural collection displacing cortical veins on both sides of the cerebral convexities, measuring up to 3 or 4 mm in thickness. No high-density hemorrhage. No hydrocephalus. Vascular: No hyperdense vessel or unexpected calcification. Skull: Normal. Negative for fracture or focal lesion. Sinuses/Orbits: No acute finding. CT CERVICAL SPINE FINDINGS Alignment: No traumatic malalignment Skull base and vertebrae: No acute fracture Soft tissues and spinal canal: No prevertebral fluid or swelling. No visible canal hematoma. Disc levels:  Ordinary cervical spine degeneration Upper chest: No acute finding IMPRESSION: 1. Small acute infarct in the right occipital cortex that has occurred since 4 days prior. 2. Suspect trace bilateral subdural hygroma, attention on presumed follow-up MRI. 3. Negative for cervical spine fracture. Electronically Signed   By: Monte Fantasia M.D.   On: 05/26/2020 12:13   MR BRAIN WO CONTRAST  Result Date: 05/26/2020 CLINICAL DATA:  Initial evaluation for neuro deficit, stroke suspected. EXAM: MRI HEAD WITHOUT CONTRAST TECHNIQUE: Multiplanar, multiecho pulse sequences of the brain and surrounding structures were obtained without intravenous contrast. COMPARISON:  Prior head CT from earlier the same day. FINDINGS: Brain: Examination technically limited as the patient was unable to tolerate the full length of the exam. Diffusion-weighted sequences with axial FLAIR and gradient echo sequences only performed. Additionally, provided images are degraded by motion. A subtle approximate 2 cm area of patchy diffusion abnormality seen involving the subcortical right occipital lobe (series 9, image 67 on axial DWI sequence, series 11, image 46 on coronal DWI sequence. Corresponding FLAIR signal abnormality seen within this region (series 14, image 11). On previous head CT from earlier the same day, there is an approximate 2 cm focal hypodensity at this location (series  4, image 14 on previous exam). Additionally, this is not clearly seen on prior head CT from 05/22/2020, consistent with a new finding. Finding consistent with an early subacute ischemic infarct. Probable faint associated petechial hemorrhage (series 15, image 11). No frank hemorrhagic transformation or significant mass effect. No other diffusion abnormality to suggest acute or subacute ischemia. Gray-white matter differentiation otherwise maintained. No other appreciable areas of chronic cortical infarction. No other evidence for acute or chronic intracranial hemorrhage on this motion degraded exam. No mass lesion, midline shift or mass effect. No hydrocephalus or extra-axial fluid collection. Vascular: Major intracranial vascular flow voids are grossly maintained at the skull base on this limited exam. Skull and upper cervical spine: Craniocervical junction grossly within normal limits. No obvious focal marrow replacing lesion. Sinuses/Orbits: Globes and orbital soft tissues grossly within normal limits. Paranasal sinuses are largely clear. Other: None. IMPRESSION: 1. Technically limited exam due to the patient's inability to tolerate the full length of the study. 2. Approximate 2 cm area of patchy diffusion and FLAIR abnormality involving the subcortical right occipital lobe, consistent with an early subacute ischemic infarct. Probable faint associated petechial hemorrhage without frank hemorrhagic transformation or significant mass effect. 3. No other definite acute intracranial abnormality on this limited exam. Electronically Signed   By: Jeannine Boga M.D.   On: 05/26/2020 19:19   DG CHEST PORT 1 VIEW  Result Date:  05/27/2020 CLINICAL DATA:  80 year old who sustained a fall at home. Acute shortness of breath. Current history of iron deficiency anemia and Alzheimer dementia. EXAM: PORTABLE CHEST 1 VIEW COMPARISON:  None. FINDINGS: Cardiac silhouette upper normal in size to slightly enlarged for the AP  portable technique. Moderate-sized hiatal hernia. Hilar mediastinal contours otherwise unremarkable. Pulmonary vascularity normal. Patchy airspace opacities at the LEFT lung base. Lungs otherwise clear. No visible pleural effusions. IMPRESSION: 1. Pneumonia favored over atelectasis at the LEFT lung base. 2. Moderate-sized hiatal hernia. Electronically Signed   By: Evangeline Dakin M.D.   On: 05/27/2020 10:02   ECHOCARDIOGRAM LIMITED  Result Date: 05/27/2020    ECHOCARDIOGRAM LIMITED REPORT   Patient Name:   MICHIE MOLNAR Date of Exam: 05/27/2020 Medical Rec #:  [id number]     Height: Accession #:    [id number]    Weight:       152.3 lb Date of Birth:  09-Sep-1940      BSA:          1.684 m Patient Age:    60 years      BP:           142/58 mmHg Patient Gender: F             HR:           83 bpm. Exam Location:  Inpatient Procedure: Limited Color Doppler, Cardiac Doppler and Limited Echo Indications:    stroke  History:        Patient has no prior history of Echocardiogram examinations.  Sonographer:    Johny Chess Referring Phys: [id number] Lewis  1. Left ventricular ejection fraction, by estimation, is 60 to 65%. The left ventricle has normal function. The left ventricle has no regional wall motion abnormalities.  2. Right ventricular systolic function is normal. The right ventricular size is normal.  3. The mitral valve is abnormal. Moderate mitral valve regurgitation. No evidence of mitral stenosis.  4. The aortic valve is normal in structure. Aortic valve regurgitation is not visualized. No aortic stenosis is present.  5. The inferior vena cava is normal in size with greater than 50% respiratory variability, suggesting right atrial pressure of 3 mmHg. FINDINGS  Left Ventricle: Left ventricular ejection fraction, by estimation, is 60 to 65%. The left ventricle has normal function. The left ventricle has no regional wall motion abnormalities. The left ventricular internal cavity size was  normal in size. There is  no left ventricular hypertrophy. Right Ventricle: The right ventricular size is normal. No increase in right ventricular wall thickness. Right ventricular systolic function is normal. Left Atrium: Left atrial size was normal in size. Right Atrium: Right atrial size was normal in size. Pericardium: There is no evidence of pericardial effusion. Mitral Valve: The mitral valve is abnormal. There is mild thickening of the mitral valve leaflet(s). There is mild calcification of the mitral valve leaflet(s). Moderate mitral valve regurgitation. No evidence of mitral valve stenosis. Tricuspid Valve: The tricuspid valve is normal in structure. Tricuspid valve regurgitation is mild . No evidence of tricuspid stenosis. Aortic Valve: The aortic valve is normal in structure. Aortic valve regurgitation is not visualized. No aortic stenosis is present. Pulmonic Valve: The pulmonic valve was normal in structure. Pulmonic valve regurgitation is not visualized. No evidence of pulmonic stenosis. Aorta: The aortic root is normal in size and structure. Venous: The inferior vena cava is normal in size with greater than 50% respiratory variability, suggesting right atrial pressure of  3 mmHg. IAS/Shunts: No atrial level shunt detected by color flow Doppler. LEFT VENTRICLE PLAX 2D LVIDd:         4.30 cm LVIDs:         2.70 cm LV PW:         1.00 cm LV IVS:        1.00 cm LVOT diam:     1.80 cm LV SV:         66 LV SV Index:   39 LVOT Area:     2.54 cm  IVC IVC diam: 1.60 cm LEFT ATRIUM         Index LA diam:    3.50 cm 2.08 cm/m  AORTIC VALVE LVOT Vmax:   136.00 cm/s LVOT Vmean:  94.300 cm/s LVOT VTI:    0.261 m  AORTA Ao Asc diam: 2.80 cm MITRAL VALVE                 TRICUSPID VALVE MV Area (PHT): 4.63 cm      TR Peak grad:   41.2 mmHg MV Decel Time: 164 msec      TR Vmax:        321.00 cm/s MR Peak grad:    133.6 mmHg MR Mean grad:    88.0 mmHg   SHUNTS MR Vmax:         578.00 cm/s Systemic VTI:  0.26 m MR  Vmean:        442.0 cm/s  Systemic Diam: 1.80 cm MR PISA:         4.02 cm MR PISA Eff ROA: 32 mm MR PISA Radius:  0.80 cm MV E velocity: 107.00 cm/s MV A velocity: 90.60 cm/s MV E/A ratio:  1.18 Jenkins Rouge MD Electronically signed by Jenkins Rouge MD Signature Date/Time: 05/27/2020/11:28:44 AM    Final     Assessment / Plan:  34.  80 year old female with a history of Alzheimer's disease admitted to the hospital with profound anemia. + FOBT. Admission Hg 4.6 -> 4.0. Transfused 2 units of PRBCs -> Hg 7.4.  Feraheme x 1 ordered. No overt GI bleeding.  -EGD with Dr. Loletha Carrow on Sunday, 05/28/2020.  I called the patient's daughter/POA Ronnell Guadalajara at [phone] reviewed the EGD procedure benefits and risks, however she did not answer my call.  She is apparently in route to the hospital therefore I asked the nursing staff to page me when her daughter arrives and I will discussed the EGD in full detail at that time.  Patient was cleared by neurology to proceed with an EGD 4/10 as scheduled. -Soft diet as tolerated then n.p.o. after midnight -CBC in a.m. -Transfuse for Hg < 8 -Pantoprazole 40mg  po QD -Continue to monitor the patient for active bleeding -Further recommendations per Dr. Loletha Carrow  2.  Small acute subcortical right occipital lobe subacute ischemic infarct. Neurology to see patient today for official neuro consult  3.  Hypokalemia -KCL replacement per the hospitalist   Addendum: I spoke to the patient's daughter/POA Ronnell Guadalajara at the bedside.  I discussed the recommendation for an EGD tomorrow with Dr. Loletha Carrow.EGD benefits and risks discussed including risk with sedation, risk of bleeding, perforation and infection. Butch Penny provided consent to proceed with the EGD as scheduled.       Active Problems:   Symptomatic anemia     LOS: 0 days   Leslie Rios  05/27/2020, 11:51 AM   I have reviewed the entire case in detail with the above APP and  discussed the plan in detail.  Therefore, I  agree with the diagnoses recorded above. In addition,  I have personally interviewed and examined the patient and have personally reviewed any abdominal/pelvic CT scan images.  My additional thoughts are as follows:  Thorough chart review performed, discussed case with NP, and I spoke with this lovely lady's daughter at the bedside during my evaluation.  Acute on chronic GI blood loss anemia.  She reportedly has a sizable hiatal hernia and "[name]" erosions from that.  There was apparently some discussion about video capsule study in the past but it has never been performed.  Her daughter tells me that her last hematology evaluation was in September of last year, and it is unclear if any regular lab work is been done at the assisted living facility.Datha was getting IV iron periodically, but certainly not since last hematology visit.  She does not have any overt GI bleeding, this appears to be most likely same problem she has had before with [name] erosions or, less likely, a more distal small bowel source of blood loss.  She reportedly had an enteroscopy during her hospitalization in [place] in August of last year.  Our plan is for an upper endoscopy tomorrow.  Her daughter was agreeable after discussion of procedure and risks.  The benefits and risks of the planned procedure were described in detail with the patient or (when appropriate) their health care proxy.  Risks were outlined as including, but not limited to, bleeding, infection, perforation, adverse medication reaction leading to cardiac or pulmonary decompensation, pancreatitis (if ERCP).  The limitation of incomplete mucosal visualization was also discussed.  No guarantees or warranties were given.  Patient at increased risk for cardiopulmonary complications of procedure due to medical comorbidities. Acute occipital CVA, probably responsible for her fall and most likely triggered by severe anemia.  Based on those findings, we can  determine the risk-benefit ratio of a video capsule study versus more diligent outpatient attention to her hemoglobin and iron levels with periodic IV iron administration.  If she has significant Cameron erosions, consideration could also be given to surgical repair of that hernia, but that would be high risk given her age and condition and acute neurologic event.  If she has a sizable hiatal hernia, video capsule study would have a high risk of being retained within the herniated stomach and therefore an incomplete study.  It would then require endoscopic delivery, which can be a significant technical challenge in itself.  All of this will be revisited with the patient's family after findings on EGD tomorrow.   Nelida Meuse III [phone]

## 2020-05-27 NOTE — Plan of Care (Signed)
  Problem: Education: Goal: Knowledge of General Education information will improve Description: Including pain rating scale, medication(s)/side effects and non-pharmacologic comfort measures Outcome: Progressing   Problem: Health Behavior/Discharge Planning: Goal: Ability to manage health-related needs will improve Outcome: Progressing   Problem: Clinical Measurements: Goal: Will remain free from infection Outcome: Progressing   Problem: Clinical Measurements: Goal: Respiratory complications will improve Outcome: Progressing   Problem: Clinical Measurements: Goal: Cardiovascular complication will be avoided Outcome: Progressing   Problem: Activity: Goal: Risk for activity intolerance will decrease Outcome: Progressing   Problem: Nutrition: Goal: Adequate nutrition will be maintained Outcome: Progressing   Problem: Pain Managment: Goal: General experience of comfort will improve Outcome: Progressing   Problem: Skin Integrity: Goal: Risk for impaired skin integrity will decrease Outcome: Progressing   Problem: Safety: Goal: Ability to remain free from injury will improve Outcome: Progressing

## 2020-05-27 NOTE — Progress Notes (Signed)
Went into patient room to start her potassium runs and pt had pulled out her IV and handed it to the Nurse. Will start potassium runs when IV team is able to insert new IV.

## 2020-05-27 NOTE — Progress Notes (Signed)
GI MD had had in not that patient needs to be NPO past midnight, RN did not see that. Patient had a soft diet order and breakfast was delivered to her this morning. Ate about 20% of her tray. Patient now instructed not to eat or drink until the doctor says so. Verabalised understanding.

## 2020-05-27 NOTE — Progress Notes (Signed)
  Echocardiogram 2D Echocardiogram has been performed.  Leslie Rios 05/27/2020, 8:51 AM

## 2020-05-27 NOTE — Progress Notes (Signed)
Informed Dr. Marva Panda on Hgb 7.5, no order.

## 2020-05-27 NOTE — Progress Notes (Signed)
Subjective:   Overnight, patient completed 2 units packed red blood cell transfusion and made NPO at midnight.  This morning, patient denies any symptoms including chest pain, shortness of breath, abdominal pain, nausea, vomiting, diarrhea, lightheadedness, dizziness or any episodes of bleeding. She does report having one regular bowel movement last night. She has no further complaints, questions or concerns.  Objective:  Vital signs in last 24 hours: Vitals:   05/26/20 2037 05/26/20 2055 05/26/20 2353 05/27/20 0443  BP: (!) 148/57 (!) 151/56 (!) 118/46 (!) 142/58  Pulse: 97 91 84 78  Resp: 17 19 17 18   Temp: 98.3 F (36.8 C) 98.2 F (36.8 C) 98.4 F (36.9 C) 97.8 F (36.6 C)  TempSrc: Oral Oral Oral Oral  SpO2: 97% 94% 95% 96%  Weight:    69.1 kg  On room air  Intake/Output Summary (Last 24 hours) at 05/27/2020 8299 Last data filed at 05/26/2020 2342 Gross per 24 hour  Intake 1282.67 ml  Output --  Net 1282.67 ml   Filed Weights   05/27/20 0443  Weight: 69.1 kg  Physical Exam Constitutional:      General: She is not in acute distress.    Appearance: She is not ill-appearing.  Cardiovascular:     Rate and Rhythm: Normal rate and regular rhythm.     Pulses: Normal pulses.     Heart sounds: Normal heart sounds.  Pulmonary:     Comments: Coarse breath sounds bilaterally Abdominal:     General: Abdomen is flat. Bowel sounds are normal.     Palpations: Abdomen is soft.     Tenderness: There is no abdominal tenderness.  Musculoskeletal:     Right lower leg: No edema.     Left lower leg: No edema.  Skin:    Capillary Refill: Capillary refill takes less than 2 seconds.  Neurological:     Mental Status: She is alert. Mental status is at baseline.     Comments: Mild lateral gaze palsy of left eye, otherwise cranial nerves intact  Psychiatric:        Mood and Affect: Mood normal.        Behavior: Behavior normal.   Labs in last 24 hours: CMP Latest Ref Rng & Units  05/27/2020 05/26/2020  Glucose 70 - 99 mg/dL 103(H) 136(H)  BUN 8 - 23 mg/dL 11 12  Creatinine 0.44 - 1.00 mg/dL 0.87 1.02(H)  Sodium 135 - 145 mmol/L 135 135  Potassium 3.5 - 5.1 mmol/L 3.3(L) 3.3(L)  Chloride 98 - 111 mmol/L 103 101  CO2 22 - 32 mmol/L 24 24  Calcium 8.9 - 10.3 mg/dL 8.6(L) 8.8(L)  Total Protein 6.5 - 8.1 g/dL 5.8(L) -  Total Bilirubin 0.3 - 1.2 mg/dL 1.3(H) -  Alkaline Phos 38 - 126 U/L 88 -  AST 15 - 41 U/L 18 -  ALT 0 - 44 U/L 15 -   Post transfusion hemoglobin and hematocrit: -Hemoglobin 7.4 -Hematocrit 24.5  Imaging in last 24 hours: MR BRAIN WO CONTRAST Result Date: 05/26/2020 IMPRESSION: 1. Technically limited exam due to the patient's inability to tolerate the full length of the study. 2. Approximate 2 cm area of patchy diffusion and FLAIR abnormality involving the subcortical right occipital lobe, consistent with an early subacute ischemic infarct. Probable faint associated petechial hemorrhage without frank hemorrhagic transformation or significant mass effect. 3. No other definite acute intracranial abnormality on this limited exam.  DG CHEST PORT 1 VIEW Result Date: 05/27/2020 IMPRESSION: 1. Pneumonia favored over  atelectasis at the LEFT lung base. 2. Moderate-sized hiatal hernia. Electronically Signed   By: Evangeline Dakin M.D.   On: 05/27/2020 10:02   ECHOCARDIOGRAM LIMITED Result Date: 05/27/2020 IMPRESSIONS  1. Left ventricular ejection fraction, by estimation, is 60 to 65%. The left ventricle has normal function. The left ventricle has no regional wall motion abnormalities.  2. Right ventricular systolic function is normal. The right ventricular size is normal.  3. The mitral valve is abnormal. Moderate mitral valve regurgitation. No evidence of mitral stenosis.  4. The aortic valve is normal in structure. Aortic valve regurgitation is not visualized. No aortic stenosis is present.  5. The inferior vena cava is normal in size with greater than 50% respiratory  variability, suggesting right atrial pressure of 3 mmHg.   Assessment/Plan:  Active Problems:   Symptomatic anemia  Leslie Rios is a 80 year old woman with past medical history significant for alzheimer disease, iron deficiency anemia due to unknown etiology, GERD in setting of hiatal hernia and HLD who presented to [facility] on 05/26/20 following a fall at home found to have significant acute on chronic microcytic anemia as well as a right occipital cortical CVA.  #Acute on chronic iron deficiency anemia #History of hiatal hernia with [name] erosions Patient continues to deny symptoms of abdominal discomfort or blood in bowel movements. Given patient's profound anemia, she requires endoscopic evaluation to assess for cause of her ongoing GI bleeding. Patient has been evaluated by gastroenterology with plans for EGD tomorrow morning.  -Gastroenterology managing  -Soft diet today, NPO at midnight  -EGD tomorrow  -Transfuse for Hb <7  -Pantoprazole 40mg  daily -CBC this afternoon and tomorrow morning -Feraheme infusion today  #Right occipital subacute CVA, active #Bilateral trace subdural hygroma, active Patient's repeat MRI consistent with CT on admission. Patient is only noted to have mild left lateral gaze palsy, otherwise there are no visual field deficits or other neuro deficits. Neurology has been consulted and we greatly appreciate their recommendations. -Neurology consulted, appreciate recommendations  -Hold anticoagulation or antiplatelets in setting of GI bleed  -Add aspirin when GI bleed stabilized  -PT/OT/SLP  #HLD, chronic Lipid panel reveals this condition to be appropriately controlled. -Continue home atorvastatin 20mg  daily  #Alzheimer's disease, chronic -Continue home memantine and donepezil  #Anxiety, chronic -Continue home lorazepam 0.5mg  twice daily -Continue home trazodone 50mg  daily -Continue home buspirone 5mg  twice daily  #VTE ppx: SCDs #IVF:  None #Code status: Full code #Bowel regimen: Miralax daily  Prior to Admission Living Arrangement: [facility] in [place] Anticipated Discharge Location: [facility] in [place] Barriers to Discharge: Continued medical management Dispo: Anticipated discharge in approximately 2-3 day(s).   Cato Mulligan, MD 05/27/2020, 6:28 AM Pager: [phone] After 5pm on weekdays and 1pm on weekends: On Call pager [phone]

## 2020-05-27 NOTE — Consult Note (Signed)
Neurology Consultation  Reason for Consult: stroke Referring Physician: Dr Philipp Ovens  CC: falls  History is obtained from: patient and chart  HPI: Leslie Rios is a 80 y.o. female past medical history for dementia, iron deficiency anemia, reflux, hyperlipidemia presented after a fall, noted to have symptomatic anemia requiring blood transfusions. Imaging done for falls revealed a possible subacute right occipital cortical stroke. Further imaging with an MRI shows a early subacute right occipital stroke. Patient denies any focal tingling numbness or weakness. Denies any visual symptoms Denies any headaches. Cannot think of any time where she had a sudden onset of any focal neurological deficit in the past few days to weeks. Neurology was consulted for the stroke. Endoscopic evaluation for GI source of bleed is scheduled during this admission. Patient is currently not on any antiplatelets or anticoagulants. Home medications also did not include any antiplatelets or anticoagulants   LKW: Sometime on Monday, 05/22/2020 tpa given?: no, symptomatic anemia, outside the window Premorbid modified Rankin scale (mRS): 2-3-not reliable    ROS: Full ROS was performed and is negative except as noted in the HPI.   Past Medical History:  Diagnosis Date  . Alzheimer disease ([place])   . Cystitis   . GERD (gastroesophageal reflux disease)   . Hypo-osmolality and hyponatremia   . Renal disorder     History reviewed. No pertinent family history.   Social History:   has no history on file for tobacco use, alcohol use, and drug use.  Medications  Current Facility-Administered Medications:  .  0.9 %  sodium chloride infusion, 10 mL/hr, Intravenous, Once, Dorie Rank, MD .  acetaminophen (TYLENOL) tablet 650 mg, 650 mg, Oral, Q6H PRN **OR** acetaminophen (TYLENOL) suppository 650 mg, 650 mg, Rectal, Q6H PRN, Jose Persia, MD .  atorvastatin (LIPITOR) tablet 20 mg, 20 mg, Oral, QHS, Cato Mulligan, MD, 20 mg at 05/26/20 2102 .  busPIRone (BUSPAR) tablet 5 mg, 5 mg, Oral, BID, Jose Persia, MD, 5 mg at 05/27/20 0912 .  donepezil (ARICEPT) tablet 10 mg, 10 mg, Oral, QHS, Velna Ochs, MD, 10 mg at 05/26/20 2103 .  ferumoxytol (FERAHEME) 510 mg in sodium chloride 0.9 % 100 mL IVPB, 510 mg, Intravenous, Once, Jose Persia, MD .  LORazepam (ATIVAN) tablet 0.5 mg, 0.5 mg, Oral, BID, Cato Mulligan, MD, 0.5 mg at 05/27/20 0912 .  memantine (NAMENDA XR) 24 hr capsule 28 mg, 28 mg, Oral, QHS, Jose Persia, MD, 28 mg at 05/26/20 2102 .  mirabegron ER (MYRBETRIQ) tablet 25 mg, 25 mg, Oral, Daily, Jose Persia, MD, 25 mg at 05/27/20 0912 .  polyethylene glycol (MIRALAX / GLYCOLAX) packet 17 g, 17 g, Oral, Daily, Charleen Kirks, [place], MD .  potassium chloride 10 mEq in 100 mL IVPB, 10 mEq, Intravenous, Q1 Hr x 6, Cato Mulligan, MD, Last Rate: 100 mL/hr at 05/27/20 1008, 10 mEq at 05/27/20 1008 .  sodium chloride flush (NS) 0.9 % injection 3 mL, 3 mL, Intravenous, Q12H, Jose Persia, MD, 3 mL at 05/27/20 0911 .  traZODone (DESYREL) tablet 50 mg, 50 mg, Oral, QHS, Jose Persia, MD, 50 mg at 05/26/20 2102 .  venlafaxine XR (EFFEXOR-XR) 24 hr capsule 150 mg, 150 mg, Oral, Q breakfast, Jose Persia, MD   Exam: Current vital signs: BP (!) 142/58 (BP Location: Right Arm)   Pulse 78   Temp 97.8 F (36.6 C) (Oral)   Resp 18   Wt 69.1 kg   SpO2 96%  Vital signs in last 24 hours: Temp:  [  97.8 F (36.6 C)-99.7 F (37.6 C)] 97.8 F (36.6 C) (04/09 0443) Pulse Rate:  [50-97] 78 (04/09 0443) Resp:  [16-26] 18 (04/09 0443) BP: (101-151)/(46-83) 142/58 (04/09 0443) SpO2:  [85 %-100 %] 96 % (04/09 0443) Weight:  [69.1 kg] 69.1 kg (04/09 0443) General: Awake alert in no distress HEENT: Normocephalic atraumatic Neck clear to oscillation Vascular: Regular rhythm Abdomen soft Nontender Extremities warm well perfused Neurological exam Awake alert oriented x3 Give me the  wrong month-said it is the month of May, on correcting her she realized that it was April. Give me the correct year.  Given the correct age. Was able to tell me where she is at this time Naming comprehension repetition intact No dysarthria Cranial nerves: Pupils equal round react light, extraocular muscles intact, visual fields full, face appears symmetric, facial sensation intact, hearing intact, tongue and palate midline. Motor exam: No drift in any of the 4 extremities with nearly symmetric strength Sensory exam intact light touch without extinction Coordination: No finger-nose-finger dysmetria Gait testing deferred NIH stroke scale-1 for wrong month  Labs I have reviewed labs in epic and the results pertinent to this consultation are:   CBC    Component Value Date/Time   WBC 8.4 05/26/2020 1320   RBC 2.76 (L) 05/26/2020 1320   HGB 7.4 (L) 05/27/2020 0319   HCT 24.5 (L) 05/27/2020 0319   PLT 238 05/26/2020 1320   MCV 59.4 (L) 05/26/2020 1320   MCH 14.5 (L) 05/26/2020 1320   MCHC 24.4 (L) 05/26/2020 1320   RDW 23.9 (H) 05/26/2020 1320    CMP     Component Value Date/Time   NA 135 05/27/2020 0319   K 3.3 (L) 05/27/2020 0319   CL 103 05/27/2020 0319   CO2 24 05/27/2020 0319   GLUCOSE 103 (H) 05/27/2020 0319   BUN 11 05/27/2020 0319   CREATININE 0.87 05/27/2020 0319   CALCIUM 8.6 (L) 05/27/2020 0319   PROT 5.8 (L) 05/27/2020 0319   ALBUMIN 2.7 (L) 05/27/2020 0319   AST 18 05/27/2020 0319   ALT 15 05/27/2020 0319   ALKPHOS 88 05/27/2020 0319   BILITOT 1.3 (H) 05/27/2020 0319   GFRNONAA >60 05/27/2020 0319    Lipid Panel     Component Value Date/Time   CHOL 90 05/26/2020 2014   TRIG 60 05/26/2020 2014   HDL 34 (L) 05/26/2020 2014   CHOLHDL 2.6 05/26/2020 2014   VLDL 12 05/26/2020 2014   [place] 44 05/26/2020 2014     Imaging I have reviewed the images obtained:  CT-scan of the brain possible subacute stroke  MRI examination of the brain confirms a  patchy diffusion and FLAIR abnormality involving the suboccipital right occipital lobe consistent with an early subacute ischemic infarct with probable faint associated petechial hemorrhage   Assessment:  80 year old woman with past medical history presenting with concerns for fall noted to have a subacute stroke in the right occipital area. Clinically, she does not have any significant neurological deficits. Primary concern at this time is symptomatic anemia and GI bleed. She is pending endoscopic evaluation by GI at this time. She will need a full stroke work-up.  Impression:  Cardioembolic versus atheroembolic right PCA stroke  Less likely to be a watershed infarct in the setting of symptomatic anemia given the location but remains a possibility  Recommendations:  Due to GI bleed-hold antiplatelet or anticoagulation  At some point, when okay with GI, she will need at least an aspirin.  If atrial fibrillation  is found on telemetry-long-term would require anticoagulation.  Frequent neurochecks  Telemetry  2D echo completed with normal LVEF, normal left atrial size.  Check A1c and lipid panel  PT  OT  Speech therapy  She is n.p.o. for the procedure  Make sure a stroke swallow screen is done before resuming diet  I was asked to specific question about "neurology clearance" for the procedure. Neurology does not usually provide any clearance for procedures.  That said, the patient does require search for source of bleeding because of her symptomatic anemia and an endoscopic procedure should be relatively safe procedure even with a subacute stroke.  Her stroke is not acute or hyperacute. Stroke team will continue to follow.  -- Amie Portland, MD Neurologist [facility] Pager: [phone]

## 2020-05-27 NOTE — Progress Notes (Addendum)
[facility] Progress Note  CC:  Anemia, + FOBT  Subjective: She denies having any nausea or vomiting.  No abdominal pain.  No chest pain or shortness of breath.  She tolerated a soft diet for breakfast.  She passed a normal soft formed brown bowel movement this morning, no rectal bleeding or melena as reported by her RN.    Objective:  Vital signs in last 24 hours: Temp:  [97.8 F (36.6 C)-99.7 F (37.6 C)] 97.8 F (36.6 C) (04/09 0443) Pulse Rate:  [50-97] 78 (04/09 0443) Resp:  [16-26] 18 (04/09 0443) BP: (101-151)/(46-82) 142/58 (04/09 0443) SpO2:  [85 %-100 %] 96 % (04/09 0443) Weight:  [69.1 kg] 69.1 kg (04/09 0443)   General:   Alert 80 year old female in NAD.  Heart: RRR, no murmur.  Pulm:  Breath sounds clear throughout.  Abdomen: Soft, nondistended.  Positive bowel sounds to all 4 quadrants Extremities:  Without edema. Neurologic:  Alert and  oriented x 3.  Speech is clear.  She moves all extremities. Psych:  Alert and cooperative. Normal mood and affect.  Intake/Output from previous day: 04/08 0701 - 04/09 0700 In: 1282.7 [I.V.:250; Blood:1032.7] Out: -  Intake/Output this shift: No intake/output data recorded.  Lab Results: Recent Labs    05/26/20 1020 05/26/20 1320 05/27/20 0319  WBC 9.3 8.4  --   HGB 4.6* 4.0* 7.4*  HCT 18.4* 16.4* 24.5*  PLT 274 238  --    BMET Recent Labs    05/26/20 1020 05/27/20 0319  NA 135 135  K 3.3* 3.3*  CL 101 103  CO2 24 24  GLUCOSE 136* 103*  BUN 12 11  CREATININE 1.02* 0.87  CALCIUM 8.8* 8.6*   LFT Recent Labs    05/27/20 0319  PROT 5.8*  ALBUMIN 2.7*  AST 18  ALT 15  ALKPHOS 88  BILITOT 1.3*   PT/INR No results for input(s): LABPROT, INR in the last 72 hours. Hepatitis Panel No results for input(s): HEPBSAG, HCVAB, HEPAIGM, HEPBIGM in the last 72 hours.  CT Head Wo Contrast  Result Date: 05/26/2020 CLINICAL DATA:  Minor head trauma.  Laceration to back of head. EXAM: CT HEAD  WITHOUT CONTRAST CT CERVICAL SPINE WITHOUT CONTRAST TECHNIQUE: Multidetector CT imaging of the head and cervical spine was performed following the standard protocol without intravenous contrast. Multiplanar CT image reconstructions of the cervical spine were also generated. COMPARISON:  05/22/2020 FINDINGS: CT HEAD FINDINGS Brain: Small low-density cortical area in the inferior right occipital lobe since prior. On coronal reformats there is question of a low-density subdural collection displacing cortical veins on both sides of the cerebral convexities, measuring up to 3 or 4 mm in thickness. No high-density hemorrhage. No hydrocephalus. Vascular: No hyperdense vessel or unexpected calcification. Skull: Normal. Negative for fracture or focal lesion. Sinuses/Orbits: No acute finding. CT CERVICAL SPINE FINDINGS Alignment: No traumatic malalignment Skull base and vertebrae: No acute fracture Soft tissues and spinal canal: No prevertebral fluid or swelling. No visible canal hematoma. Disc levels:  Ordinary cervical spine degeneration Upper chest: No acute finding IMPRESSION: 1. Small acute infarct in the right occipital cortex that has occurred since 4 days prior. 2. Suspect trace bilateral subdural hygroma, attention on presumed follow-up MRI. 3. Negative for cervical spine fracture. Electronically Signed   By: Monte Fantasia M.D.   On: 05/26/2020 12:13   CT Cervical Spine Wo Contrast  Result Date: 05/26/2020 CLINICAL DATA:  Minor head trauma.  Laceration to back  of head. EXAM: CT HEAD WITHOUT CONTRAST CT CERVICAL SPINE WITHOUT CONTRAST TECHNIQUE: Multidetector CT imaging of the head and cervical spine was performed following the standard protocol without intravenous contrast. Multiplanar CT image reconstructions of the cervical spine were also generated. COMPARISON:  05/22/2020 FINDINGS: CT HEAD FINDINGS Brain: Small low-density cortical area in the inferior right occipital lobe since prior. On coronal reformats  there is question of a low-density subdural collection displacing cortical veins on both sides of the cerebral convexities, measuring up to 3 or 4 mm in thickness. No high-density hemorrhage. No hydrocephalus. Vascular: No hyperdense vessel or unexpected calcification. Skull: Normal. Negative for fracture or focal lesion. Sinuses/Orbits: No acute finding. CT CERVICAL SPINE FINDINGS Alignment: No traumatic malalignment Skull base and vertebrae: No acute fracture Soft tissues and spinal canal: No prevertebral fluid or swelling. No visible canal hematoma. Disc levels:  Ordinary cervical spine degeneration Upper chest: No acute finding IMPRESSION: 1. Small acute infarct in the right occipital cortex that has occurred since 4 days prior. 2. Suspect trace bilateral subdural hygroma, attention on presumed follow-up MRI. 3. Negative for cervical spine fracture. Electronically Signed   By: Monte Fantasia M.D.   On: 05/26/2020 12:13   MR BRAIN WO CONTRAST  Result Date: 05/26/2020 CLINICAL DATA:  Initial evaluation for neuro deficit, stroke suspected. EXAM: MRI HEAD WITHOUT CONTRAST TECHNIQUE: Multiplanar, multiecho pulse sequences of the brain and surrounding structures were obtained without intravenous contrast. COMPARISON:  Prior head CT from earlier the same day. FINDINGS: Brain: Examination technically limited as the patient was unable to tolerate the full length of the exam. Diffusion-weighted sequences with axial FLAIR and gradient echo sequences only performed. Additionally, provided images are degraded by motion. A subtle approximate 2 cm area of patchy diffusion abnormality seen involving the subcortical right occipital lobe (series 9, image 67 on axial DWI sequence, series 11, image 46 on coronal DWI sequence. Corresponding FLAIR signal abnormality seen within this region (series 14, image 11). On previous head CT from earlier the same day, there is an approximate 2 cm focal hypodensity at this location (series  4, image 14 on previous exam). Additionally, this is not clearly seen on prior head CT from 05/22/2020, consistent with a new finding. Finding consistent with an early subacute ischemic infarct. Probable faint associated petechial hemorrhage (series 15, image 11). No frank hemorrhagic transformation or significant mass effect. No other diffusion abnormality to suggest acute or subacute ischemia. Gray-white matter differentiation otherwise maintained. No other appreciable areas of chronic cortical infarction. No other evidence for acute or chronic intracranial hemorrhage on this motion degraded exam. No mass lesion, midline shift or mass effect. No hydrocephalus or extra-axial fluid collection. Vascular: Major intracranial vascular flow voids are grossly maintained at the skull base on this limited exam. Skull and upper cervical spine: Craniocervical junction grossly within normal limits. No obvious focal marrow replacing lesion. Sinuses/Orbits: Globes and orbital soft tissues grossly within normal limits. Paranasal sinuses are largely clear. Other: None. IMPRESSION: 1. Technically limited exam due to the patient's inability to tolerate the full length of the study. 2. Approximate 2 cm area of patchy diffusion and FLAIR abnormality involving the subcortical right occipital lobe, consistent with an early subacute ischemic infarct. Probable faint associated petechial hemorrhage without frank hemorrhagic transformation or significant mass effect. 3. No other definite acute intracranial abnormality on this limited exam. Electronically Signed   By: Jeannine Boga M.D.   On: 05/26/2020 19:19   DG CHEST PORT 1 VIEW  Result Date:  05/27/2020 CLINICAL DATA:  80 year old who sustained a fall at home. Acute shortness of breath. Current history of iron deficiency anemia and Alzheimer dementia. EXAM: PORTABLE CHEST 1 VIEW COMPARISON:  None. FINDINGS: Cardiac silhouette upper normal in size to slightly enlarged for the AP  portable technique. Moderate-sized hiatal hernia. Hilar mediastinal contours otherwise unremarkable. Pulmonary vascularity normal. Patchy airspace opacities at the LEFT lung base. Lungs otherwise clear. No visible pleural effusions. IMPRESSION: 1. Pneumonia favored over atelectasis at the LEFT lung base. 2. Moderate-sized hiatal hernia. Electronically Signed   By: Evangeline Dakin M.D.   On: 05/27/2020 10:02   ECHOCARDIOGRAM LIMITED  Result Date: 05/27/2020    ECHOCARDIOGRAM LIMITED REPORT   Patient Name:   TIJANA WALDER Date of Exam: 05/27/2020 Medical Rec #:  [id number]     Height: Accession #:    [id number]    Weight:       152.3 lb Date of Birth:  [birth year]      BSA:          1.684 m Patient Age:    59 years      BP:           142/58 mmHg Patient Gender: F             HR:           83 bpm. Exam Location:  Inpatient Procedure: Limited Color Doppler, Cardiac Doppler and Limited Echo Indications:    stroke  History:        Patient has no prior history of Echocardiogram examinations.  Sonographer:    Johny Chess Referring Phys: [id number] [place]  1. Left ventricular ejection fraction, by estimation, is 60 to 65%. The left ventricle has normal function. The left ventricle has no regional wall motion abnormalities.  2. Right ventricular systolic function is normal. The right ventricular size is normal.  3. The mitral valve is abnormal. Moderate mitral valve regurgitation. No evidence of mitral stenosis.  4. The aortic valve is normal in structure. Aortic valve regurgitation is not visualized. No aortic stenosis is present.  5. The inferior vena cava is normal in size with greater than 50% respiratory variability, suggesting right atrial pressure of 3 mmHg. FINDINGS  Left Ventricle: Left ventricular ejection fraction, by estimation, is 60 to 65%. The left ventricle has normal function. The left ventricle has no regional wall motion abnormalities. The left ventricular internal cavity size was  normal in size. There is  no left ventricular hypertrophy. Right Ventricle: The right ventricular size is normal. No increase in right ventricular wall thickness. Right ventricular systolic function is normal. Left Atrium: Left atrial size was normal in size. Right Atrium: Right atrial size was normal in size. Pericardium: There is no evidence of pericardial effusion. Mitral Valve: The mitral valve is abnormal. There is mild thickening of the mitral valve leaflet(s). There is mild calcification of the mitral valve leaflet(s). Moderate mitral valve regurgitation. No evidence of mitral valve stenosis. Tricuspid Valve: The tricuspid valve is normal in structure. Tricuspid valve regurgitation is mild . No evidence of tricuspid stenosis. Aortic Valve: The aortic valve is normal in structure. Aortic valve regurgitation is not visualized. No aortic stenosis is present. Pulmonic Valve: The pulmonic valve was normal in structure. Pulmonic valve regurgitation is not visualized. No evidence of pulmonic stenosis. Aorta: The aortic root is normal in size and structure. Venous: The inferior vena cava is normal in size with greater than 50% respiratory variability, suggesting right atrial pressure of  3 mmHg. IAS/Shunts: No atrial level shunt detected by color flow Doppler. LEFT VENTRICLE PLAX 2D LVIDd:         4.30 cm LVIDs:         2.70 cm LV PW:         1.00 cm LV IVS:        1.00 cm LVOT diam:     1.80 cm LV SV:         66 LV SV Index:   39 LVOT Area:     2.54 cm  IVC IVC diam: 1.60 cm LEFT ATRIUM         Index LA diam:    3.50 cm 2.08 cm/m  AORTIC VALVE LVOT Vmax:   136.00 cm/s LVOT Vmean:  94.300 cm/s LVOT VTI:    0.261 m  AORTA Ao Asc diam: 2.80 cm MITRAL VALVE                 TRICUSPID VALVE MV Area (PHT): 4.63 cm      TR Peak grad:   41.2 mmHg MV Decel Time: 164 msec      TR Vmax:        321.00 cm/s MR Peak grad:    133.6 mmHg MR Mean grad:    88.0 mmHg   SHUNTS MR Vmax:         578.00 cm/s Systemic VTI:  0.26 m MR  Vmean:        442.0 cm/s  Systemic Diam: 1.80 cm MR PISA:         4.02 cm MR PISA Eff ROA: 32 mm MR PISA Radius:  0.80 cm MV E velocity: 107.00 cm/s MV A velocity: 90.60 cm/s MV E/A ratio:  1.18 Jenkins Rouge MD Electronically signed by Jenkins Rouge MD Signature Date/Time: 05/27/2020/11:28:44 AM    Final     Assessment / Plan:  43.  80 year old female with a history of Alzheimer's disease admitted to the hospital with profound anemia. + FOBT. Admission Hg 4.6 -> 4.0. Transfused 2 units of PRBCs -> Hg 7.4.  Feraheme x 1 ordered. No overt GI bleeding.  -EGD with Dr. Loletha Carrow on Sunday, 05/28/2020.  I called the patient's daughter/POA Ronnell Guadalajara at [phone] reviewed the EGD procedure benefits and risks, however she did not answer my call.  She is apparently in route to the hospital therefore I asked the nursing staff to page me when her daughter arrives and I will discussed the EGD in full detail at that time.  Patient was cleared by neurology to proceed with an EGD 4/10 as scheduled. -Soft diet as tolerated then n.p.o. after midnight -CBC in a.m. -Transfuse for Hg < 8 -Pantoprazole 40mg  po QD -Continue to monitor the patient for active bleeding -Further recommendations per Dr. Loletha Carrow  2.  Small acute subcortical right occipital lobe subacute ischemic infarct. Neurology to see patient today for official neuro consult  3.  Hypokalemia -KCL replacement per the hospitalist   Addendum: I spoke to the patient's daughter/POA Ronnell Guadalajara at the bedside.  I discussed the recommendation for an EGD tomorrow with Dr. Loletha Carrow.EGD benefits and risks discussed including risk with sedation, risk of bleeding, perforation and infection. Butch Penny provided consent to proceed with the EGD as scheduled.       Active Problems:   Symptomatic anemia     LOS: 0 days   Noralyn Pick  05/27/2020, 11:51 AM   I have reviewed the entire case in detail with the above APP and  discussed the plan in detail.  Therefore, I  agree with the diagnoses recorded above. In addition,  I have personally interviewed and examined the patient and have personally reviewed any abdominal/pelvic CT scan images.  My additional thoughts are as follows:  Thorough chart review performed, discussed case with NP, and I spoke with this lovely lady's daughter at the bedside during my evaluation.  Acute on chronic GI blood loss anemia.  She reportedly has a sizable hiatal hernia and "[name]" erosions from that.  There was apparently some discussion about video capsule study in the past but it has never been performed.  Her daughter tells me that her last hematology evaluation was in September of last year, and it is unclear if any regular lab work is been done at the assisted living facility.Kaliyah was getting IV iron periodically, but certainly not since last hematology visit.  She does not have any overt GI bleeding, this appears to be most likely same problem she has had before with [name] erosions or, less likely, a more distal small bowel source of blood loss.  She reportedly had an enteroscopy during her hospitalization in [place] in August of last year.  Our plan is for an upper endoscopy tomorrow.  Her daughter was agreeable after discussion of procedure and risks.  The benefits and risks of the planned procedure were described in detail with the patient or (when appropriate) their health care proxy.  Risks were outlined as including, but not limited to, bleeding, infection, perforation, adverse medication reaction leading to cardiac or pulmonary decompensation, pancreatitis (if ERCP).  The limitation of incomplete mucosal visualization was also discussed.  No guarantees or warranties were given.  Patient at increased risk for cardiopulmonary complications of procedure due to medical comorbidities. Acute occipital CVA, probably responsible for her fall and most likely triggered by severe anemia.  Based on those findings, we can  determine the risk-benefit ratio of a video capsule study versus more diligent outpatient attention to her hemoglobin and iron levels with periodic IV iron administration.  If she has significant Cameron erosions, consideration could also be given to surgical repair of that hernia, but that would be high risk given her age and condition and acute neurologic event.  If she has a sizable hiatal hernia, video capsule study would have a high risk of being retained within the herniated stomach and therefore an incomplete study.  It would then require endoscopic delivery, which can be a significant technical challenge in itself.  All of this will be revisited with the patient's family after findings on EGD tomorrow.   Nelida Meuse III [phone]

## 2020-05-28 ENCOUNTER — Encounter (HOSPITAL_COMMUNITY): Payer: Self-pay | Admitting: Internal Medicine

## 2020-05-28 ENCOUNTER — Inpatient Hospital Stay (HOSPITAL_COMMUNITY): Payer: Medicare HMO | Admitting: Certified Registered Nurse Anesthetist

## 2020-05-28 ENCOUNTER — Encounter (HOSPITAL_COMMUNITY)
Admission: EM | Disposition: A | Discharge status: Home Health | Payer: Self-pay | Source: Skilled Nursing Facility | Attending: Internal Medicine

## 2020-05-28 ENCOUNTER — Observation Stay (HOSPITAL_COMMUNITY): Payer: Medicare HMO

## 2020-05-28 ENCOUNTER — Inpatient Hospital Stay (HOSPITAL_COMMUNITY): Payer: Medicare HMO

## 2020-05-28 DIAGNOSIS — Z23 Encounter for immunization: Secondary | ICD-10-CM | POA: Diagnosis not present

## 2020-05-28 DIAGNOSIS — D62 Acute posthemorrhagic anemia: Secondary | ICD-10-CM | POA: Diagnosis present

## 2020-05-28 DIAGNOSIS — K219 Gastro-esophageal reflux disease without esophagitis: Secondary | ICD-10-CM | POA: Diagnosis present

## 2020-05-28 DIAGNOSIS — Z20822 Contact with and (suspected) exposure to covid-19: Secondary | ICD-10-CM | POA: Diagnosis present

## 2020-05-28 DIAGNOSIS — S065X0A Traumatic subdural hemorrhage without loss of consciousness, initial encounter: Secondary | ICD-10-CM | POA: Diagnosis present

## 2020-05-28 DIAGNOSIS — S0011XA Contusion of right eyelid and periocular area, initial encounter: Secondary | ICD-10-CM | POA: Diagnosis present

## 2020-05-28 DIAGNOSIS — S0012XA Contusion of left eyelid and periocular area, initial encounter: Secondary | ICD-10-CM | POA: Diagnosis present

## 2020-05-28 DIAGNOSIS — E785 Hyperlipidemia, unspecified: Secondary | ICD-10-CM | POA: Diagnosis present

## 2020-05-28 DIAGNOSIS — F419 Anxiety disorder, unspecified: Secondary | ICD-10-CM | POA: Diagnosis present

## 2020-05-28 DIAGNOSIS — S0003XA Contusion of scalp, initial encounter: Secondary | ICD-10-CM | POA: Diagnosis present

## 2020-05-28 DIAGNOSIS — D649 Anemia, unspecified: Secondary | ICD-10-CM | POA: Diagnosis not present

## 2020-05-28 DIAGNOSIS — G9608 Other cranial cerebrospinal fluid leak: Secondary | ICD-10-CM | POA: Diagnosis present

## 2020-05-28 DIAGNOSIS — Z882 Allergy status to sulfonamides status: Secondary | ICD-10-CM | POA: Diagnosis not present

## 2020-05-28 DIAGNOSIS — K317 Polyp of stomach and duodenum: Secondary | ICD-10-CM

## 2020-05-28 DIAGNOSIS — J810 Acute pulmonary edema: Secondary | ICD-10-CM | POA: Diagnosis present

## 2020-05-28 DIAGNOSIS — S0990XA Unspecified injury of head, initial encounter: Secondary | ICD-10-CM | POA: Diagnosis not present

## 2020-05-28 DIAGNOSIS — W07XXXA Fall from chair, initial encounter: Secondary | ICD-10-CM | POA: Diagnosis present

## 2020-05-28 DIAGNOSIS — G309 Alzheimer's disease, unspecified: Secondary | ICD-10-CM | POA: Diagnosis present

## 2020-05-28 DIAGNOSIS — R0902 Hypoxemia: Secondary | ICD-10-CM | POA: Diagnosis not present

## 2020-05-28 DIAGNOSIS — Z8673 Personal history of transient ischemic attack (TIA), and cerebral infarction without residual deficits: Secondary | ICD-10-CM | POA: Diagnosis not present

## 2020-05-28 DIAGNOSIS — E876 Hypokalemia: Secondary | ICD-10-CM | POA: Diagnosis present

## 2020-05-28 DIAGNOSIS — Z8262 Family history of osteoporosis: Secondary | ICD-10-CM | POA: Diagnosis not present

## 2020-05-28 DIAGNOSIS — R296 Repeated falls: Secondary | ICD-10-CM | POA: Diagnosis present

## 2020-05-28 DIAGNOSIS — Z8249 Family history of ischemic heart disease and other diseases of the circulatory system: Secondary | ICD-10-CM | POA: Diagnosis not present

## 2020-05-28 DIAGNOSIS — D500 Iron deficiency anemia secondary to blood loss (chronic): Secondary | ICD-10-CM | POA: Diagnosis present

## 2020-05-28 DIAGNOSIS — F0280 Dementia in other diseases classified elsewhere without behavioral disturbance: Secondary | ICD-10-CM | POA: Diagnosis present

## 2020-05-28 DIAGNOSIS — S065X9A Traumatic subdural hemorrhage with loss of consciousness of unspecified duration, initial encounter: Secondary | ICD-10-CM | POA: Diagnosis not present

## 2020-05-28 DIAGNOSIS — Y92129 Unspecified place in nursing home as the place of occurrence of the external cause: Secondary | ICD-10-CM | POA: Diagnosis not present

## 2020-05-28 DIAGNOSIS — J90 Pleural effusion, not elsewhere classified: Secondary | ICD-10-CM | POA: Diagnosis present

## 2020-05-28 DIAGNOSIS — K449 Diaphragmatic hernia without obstruction or gangrene: Secondary | ICD-10-CM | POA: Diagnosis present

## 2020-05-28 DIAGNOSIS — Z823 Family history of stroke: Secondary | ICD-10-CM | POA: Diagnosis not present

## 2020-05-28 DIAGNOSIS — S199XXA Unspecified injury of neck, initial encounter: Secondary | ICD-10-CM | POA: Diagnosis not present

## 2020-05-28 DIAGNOSIS — I672 Cerebral atherosclerosis: Secondary | ICD-10-CM | POA: Diagnosis not present

## 2020-05-28 DIAGNOSIS — D181 Lymphangioma, any site: Secondary | ICD-10-CM | POA: Diagnosis not present

## 2020-05-28 DIAGNOSIS — D509 Iron deficiency anemia, unspecified: Secondary | ICD-10-CM | POA: Diagnosis present

## 2020-05-28 DIAGNOSIS — Z79899 Other long term (current) drug therapy: Secondary | ICD-10-CM | POA: Diagnosis not present

## 2020-05-28 HISTORY — PX: ESOPHAGOGASTRODUODENOSCOPY (EGD) WITH PROPOFOL: SHX5813

## 2020-05-28 LAB — BASIC METABOLIC PANEL
Anion gap: 8 (ref 5–15)
BUN: 8 mg/dL (ref 8–23)
CO2: 22 mmol/L (ref 22–32)
Calcium: 8.6 mg/dL — ABNORMAL LOW (ref 8.9–10.3)
Chloride: 104 mmol/L (ref 98–111)
Creatinine, Ser: 0.71 mg/dL (ref 0.44–1.00)
GFR, Estimated: >60 mL/min (ref >60)
Glucose, Bld: 101 mg/dL — ABNORMAL HIGH (ref 70–99)
Potassium: 3.8 mmol/L (ref 3.5–5.1)
Sodium: 134 mmol/L — ABNORMAL LOW (ref 135–145)

## 2020-05-28 LAB — CBC
HCT: 25.1 % — ABNORMAL LOW (ref 36.0–46.0)
Hemoglobin: 7.3 g/dL — ABNORMAL LOW (ref 12.0–15.0)
MCH: 19.6 pg — ABNORMAL LOW (ref 26.0–34.0)
MCHC: 29.1 g/dL — ABNORMAL LOW (ref 30.0–36.0)
MCV: 67.3 fL — ABNORMAL LOW (ref 80.0–100.0)
Platelets: 206 10*3/uL (ref 150–400)
RBC: 3.73 MIL/uL — ABNORMAL LOW (ref 3.87–5.11)
RDW: 30.7 % — ABNORMAL HIGH (ref 11.5–15.5)
WBC: 11.7 10*3/uL — ABNORMAL HIGH (ref 4.0–10.5)
nRBC: 0.4 % — ABNORMAL HIGH (ref 0.0–0.2)

## 2020-05-28 LAB — RESP PANEL BY RT-PCR (FLU A&B, COVID) ARPGX2
Influenza A by PCR: NEGATIVE
Influenza B by PCR: NEGATIVE
SARS Coronavirus 2 by RT PCR: NEGATIVE

## 2020-05-28 IMAGING — MR MR HEAD WO/W CM
13 of 15 series · 40 of 48 positions shown · IV contrast (gadavist)
Comparison: CT angio head and neck [DATE].  MRI head [DATE]

CLINICAL DATA: Stroke follow-up.  History of fall.  Confusion.

EXAM:
MRI HEAD WITHOUT AND WITH CONTRAST
TECHNIQUE: Multiplanar, multiecho pulse sequences of the brain and surrounding
structures were obtained without and with intravenous contrast.
CONTRAST:  7.5mL GADAVIST GADOBUTROL 1 MMOL/ML IV SOLN

[Series 5: DWI · axial · 3.0mm · 0.88mm/px · z∈[-80,+60]mm · 7 of 96 slices shown (1 of 4)]
[im 1/96]
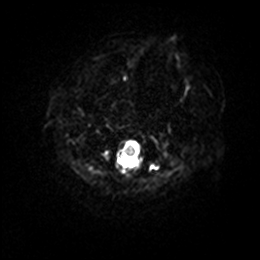
[im 16/96]
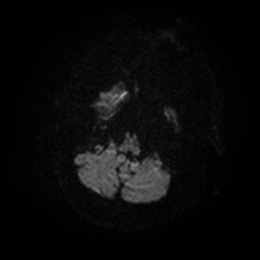
[im 32/96]
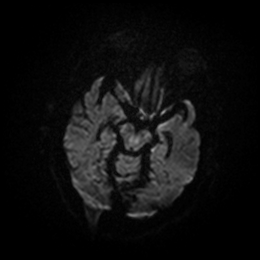
[im 48/96]
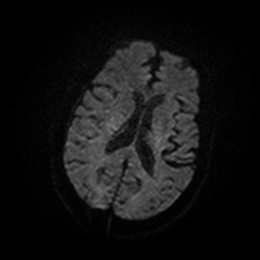
[im 64/96]
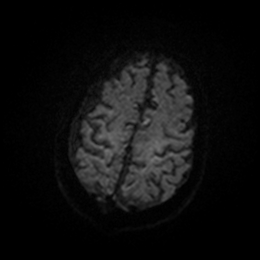
[im 80/96]
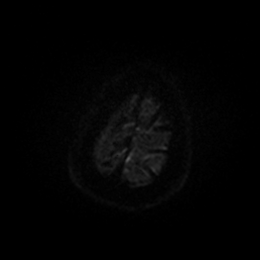
[im 96/96]
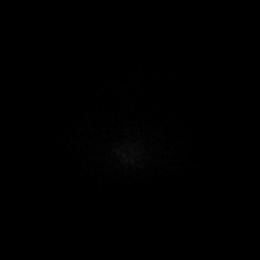

[Series 6: DWI · axial · 3.0mm · 0.88mm/px · z∈[-80,+60]mm · 3 of 48 slices shown (2 of 4)]
[im 1/48]
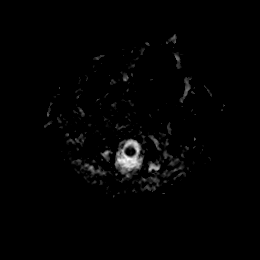
[im 24/48]
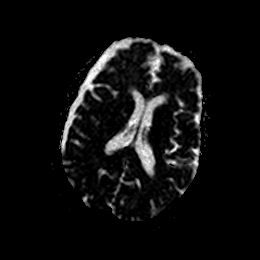
[im 48/48]
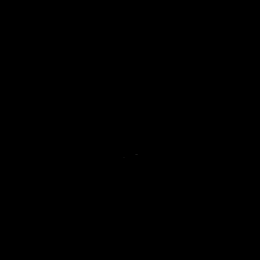

[Series 10: T2 · axial · 5.0mm · 0.72mm/px · z∈[-83,+60]mm · 2 of 25 slices shown]
[im 1/25]
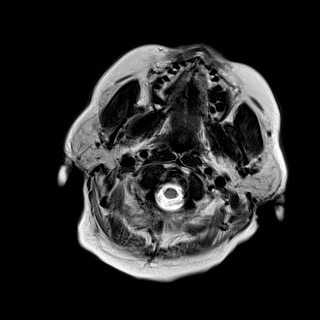
[im 25/25]
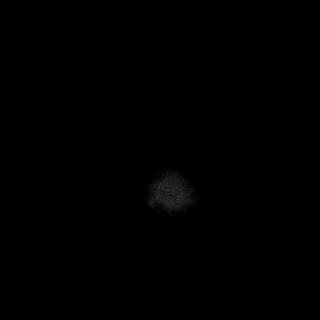

[Series 11: DWI · coronal · 4.0mm · 0.88mm/px · 5 of 70 slices shown (3 of 4)]
[im 1/70]
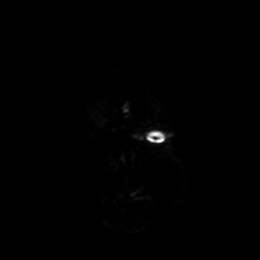
[im 18/70]
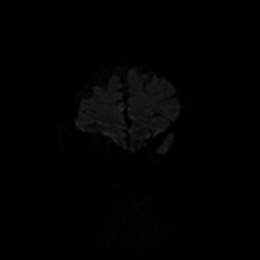
[im 35/70]
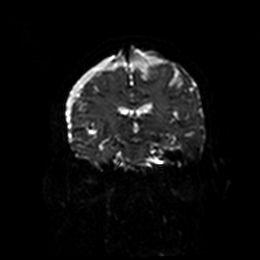
[im 52/70]
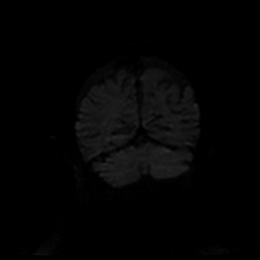
[im 70/70]
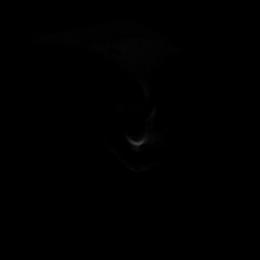

[Series 12: DWI · coronal · 4.0mm · 0.88mm/px · 2 of 35 slices shown (4 of 4)]
[im 1/35]
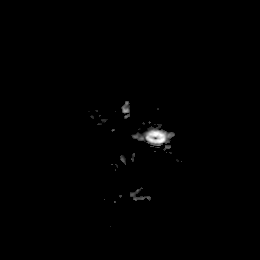
[im 35/35]
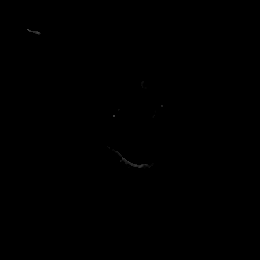

[Series 18: T1 · sagittal · 5.0mm · 0.75mm/px · 2 of 23 slices shown]
[im 1/23]
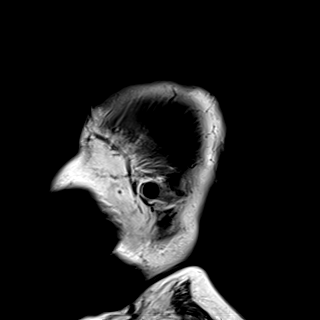
[im 23/23]
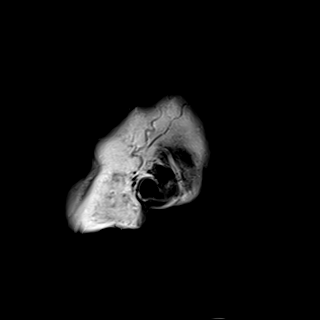

[Series 19: FLAIR · axial · 5.0mm · 0.45mm/px · z∈[-110,+32]mm · 2 of 25 slices shown]
[im 1/25]
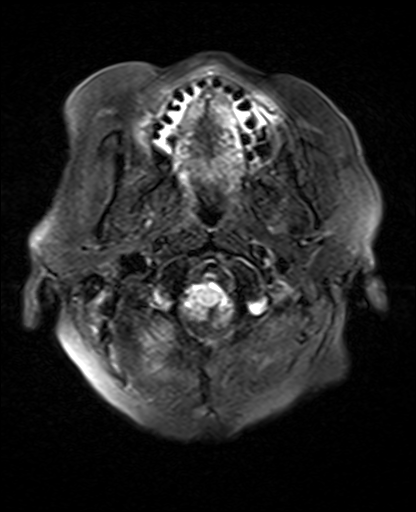
[im 25/25]
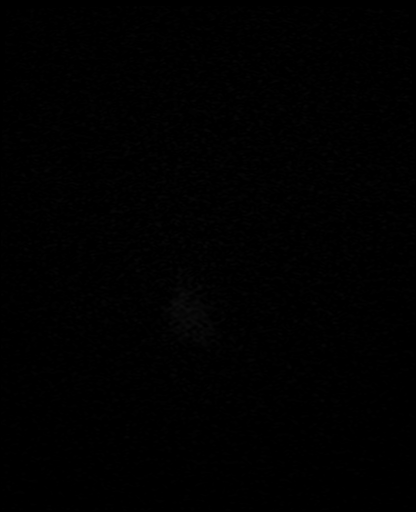

[Series 20: mag_images · axial · 3.0mm · 0.90mm/px · z∈[-116,+47]mm · 4 of 56 slices shown]
[im 1/56]
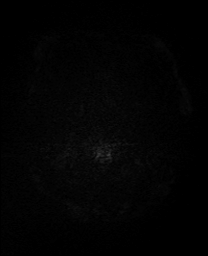
[im 19/56]
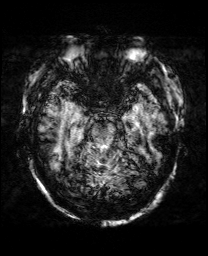
[im 37/56]
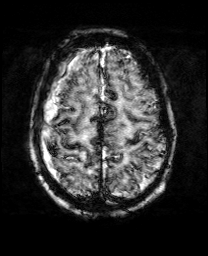
[im 56/56]
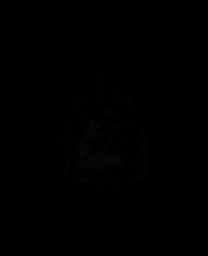

[Series 21: pha_images · axial · 3.0mm · 0.90mm/px · z∈[-116,+47]mm · 4 of 55 slices shown]
[im 1/55]
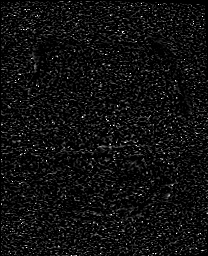
[im 19/55]
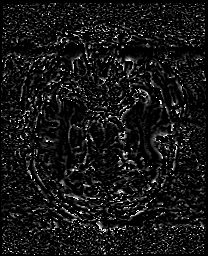
[im 37/55]
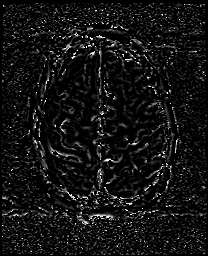
[im 55/55]
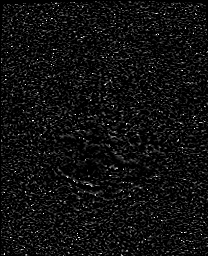

[Series 23: mip_images(sw) · axial · 24.0mm · 0.90mm/px · z∈[-106,+37]mm · 3 of 49 slices shown]
[im 1/49]
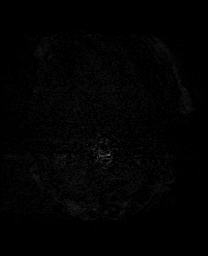
[im 25/49]
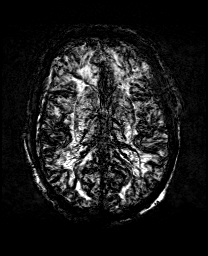
[im 49/49]
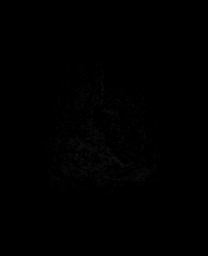

[Series 25: T2 post-contrast · coronal · 5.0mm · 0.72mm/px · 2 of 28 slices shown]
[im 1/28]
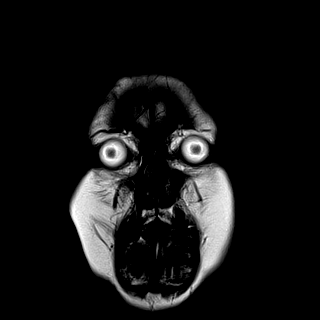
[im 28/28]
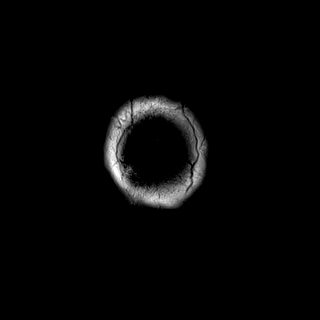

[Series 27: T1 post-contrast · coronal · 5.0mm · 0.34mm/px · 2 of 28 slices shown (1 of 2)]
[im 1/28]
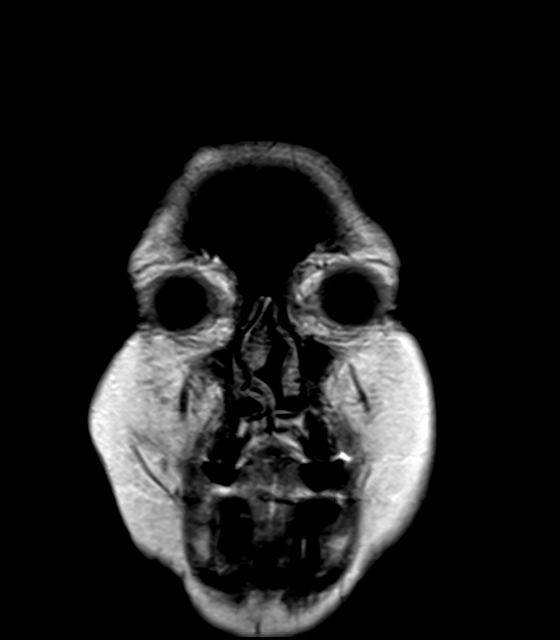
[im 28/28]
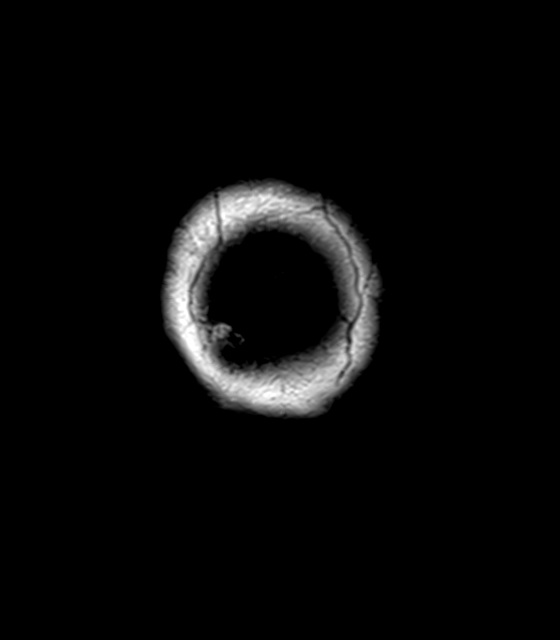

[Series 28: T1 post-contrast · sagittal · 5.0mm · 0.72mm/px · 2 of 23 slices shown (2 of 2)]
[im 1/23]
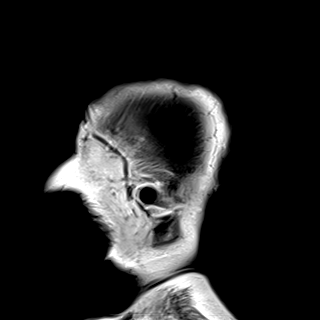
[im 23/23]
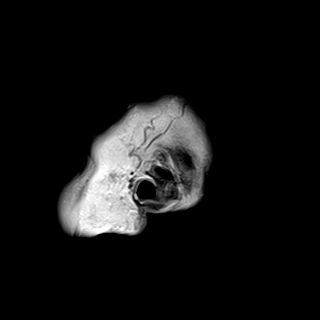

[40 of 48 positions shown; findings below may reference images not displayed]

FINDINGS: Brain: 7 mm CSF density extra-axial fluid collection on the right
unchanged from the CT today but progressive since the prior MRI.
This contains mild increased signal on FLAIR suggesting a small
amount of associated hemorrhage. This is low density on CT.
Interhemispheric subdural hematoma to the left of midline has
developed since the prior MRI. This measures approximately 4 mm in
thickness. This is seen on T1 and FLAIR is hyperintensity. This is
low density on CT. Small subdural hematoma over the left convexity.

1.5 x 2.5 cm lesion in the right inferior occipital lobe best seen
on FLAIR and unchanged from the prior MRI. There is mild associated
hyperintensity on T1. See sagittal T1 image 9 series 18 and
postcontrast sagittal image 9. Susceptibility weighted imaging of
poor quality.

Ventricle size normal. Slight midline shift to the left. Negative
for acute infarct. Diffusion-weighted imaging suboptimal in quality.
Negative for mass lesion.

Vascular: Normal arterial flow voids.

Skull and upper cervical spine: Negative

Sinuses/Orbits: Negative

Other: None
IMPRESSION: Negative for acute infarct

Compared with the MRI of [DATE], interval development of
subdural hematomas bilaterally as well as in the interhemispheric
fissure. These appear to be CSF density on CT however there is
evidence of hemorrhage on MRI. Findings compatible with recent head
injury.

[DATE] x 2.5 cm hyperintensity in the right inferior occipital lobe
best seen on FLAIR. Mild hyperintensity on T1. No abnormal
enhancement. No restricted diffusion. No change from prior MRI.
Possible subacute infarct with hemorrhage.

## 2020-05-28 IMAGING — CT CT ANGIO HEAD
1 of 11 series · 5 of 33 positions shown · IV contrast (OMNI 350)
Comparison: CT head MRI head [DATE]

CLINICAL DATA: Stroke/TIA.  Recent head trauma

EXAM:
CT ANGIOGRAPHY HEAD AND NECK
TECHNIQUE: Multidetector CT imaging of the head and neck was performed using
the standard protocol during bolus administration of intravenous
contrast. Multiplanar CT image reconstructions and MIPs were
obtained to evaluate the vascular anatomy. Carotid stenosis
measurements (when applicable) are obtained utilizing NASCET
criteria, using the distal internal carotid diameter as the
denominator.
CONTRAST:  75mL OMNIPAQUE IOHEXOL 350 MG/ML SOLN

[Series 7: cta neck axial · axial · 0.39mm/px · z∈[-251,-37]mm · 5 of 322 slices shown]
[im 54/322  soft-tissue]
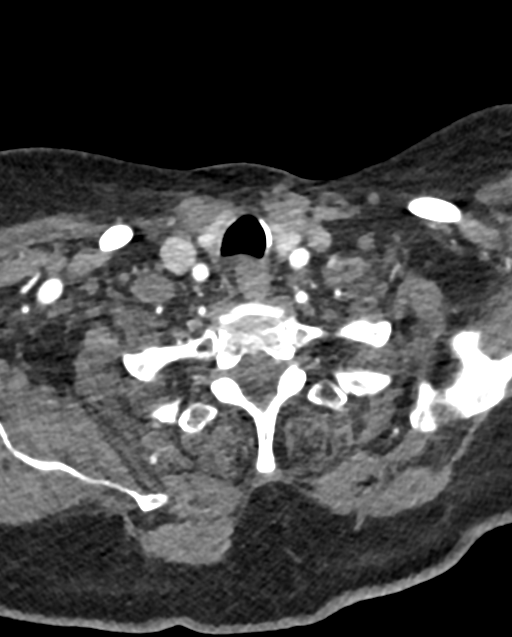
[im 108/322  bone]
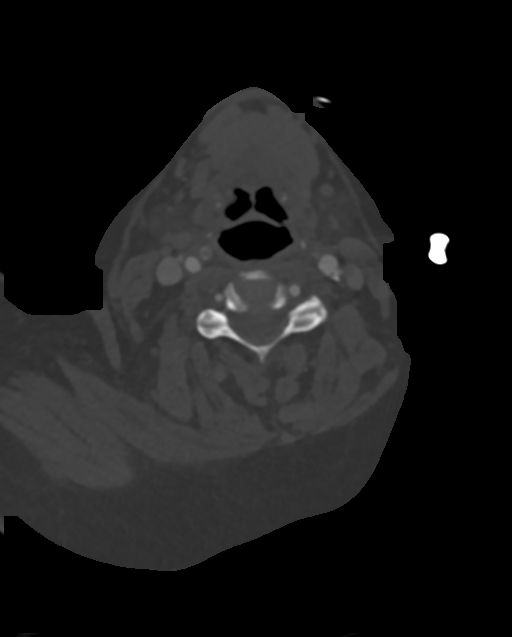
[im 161/322  soft-tissue]
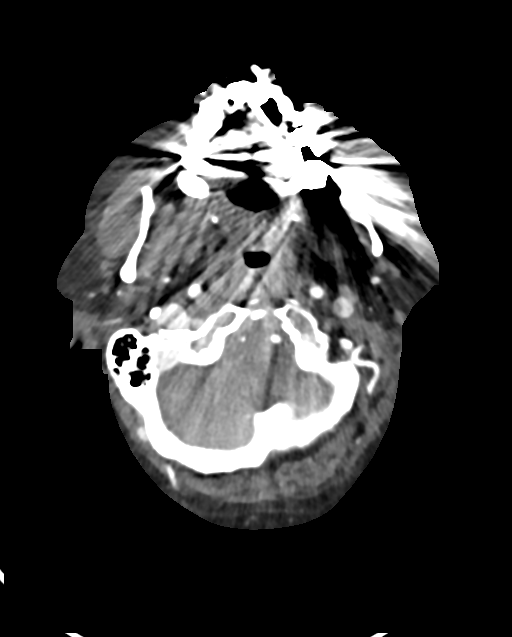
[im 215/322  bone]
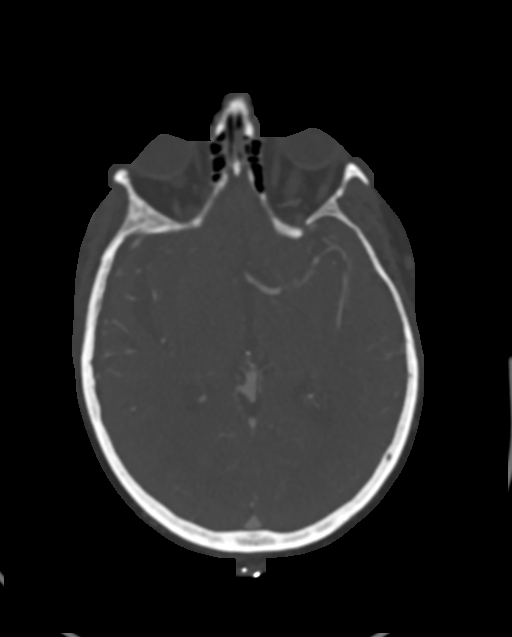
[im 268/322  soft-tissue]
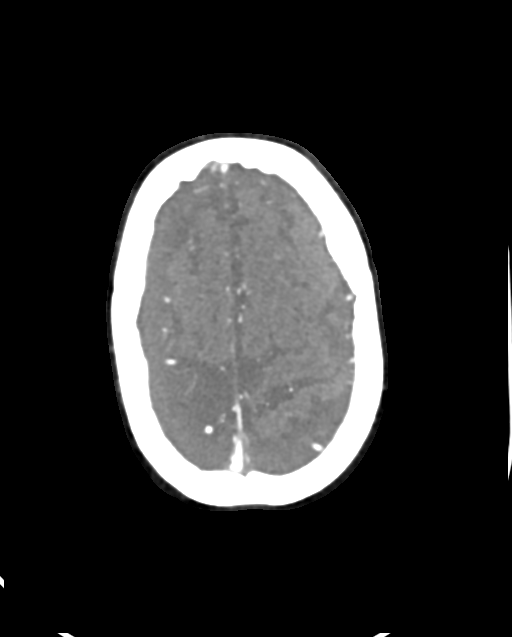

[5 of 33 positions shown; findings below may reference images not displayed]

FINDINGS: CT HEAD FINDINGS

Brain: Small hypodensity right medial occipital lobe unchanged from
the prior CT. Possible subacute infarct based on MRI. No associated
hemorrhage.

Progression of extra-axial subdural hygroma on the right now
measuring approximately 7 mm in thickness. Mild septal deviation to
the left is unchanged.

Vascular: Negative for hyperdense vessel

Skull: Negative for fracture. Skin staples posteriorly on the right.

Sinuses: Mild mucosal edema paranasal sinuses. Extensive septal
deviation to the right.

Orbits: Negative

Review of the MIP images confirms the above findings

CTA NECK FINDINGS

Aortic arch: Standard branching. Imaged portion shows no evidence of
aneurysm or dissection. No significant stenosis of the major arch
vessel origins.

Right carotid system: No significant stenosis. Noncalcified plaque
right carotid bulb.

Left carotid system: Atherosclerotic calcification left carotid bulb
without significant stenosis. Negative for dissection.

Vertebral arteries: Left vertebral artery dominant. Both vertebral
arteries patent to the basilar without stenosis.

Skeleton: Cervical spondylosis.  No acute abnormality.

Other neck: Negative

Upper chest: Bilateral pleural effusions. Patchy airspace disease
right upper lobe, incompletely evaluated.

Review of the MIP images confirms the above findings

CTA HEAD FINDINGS

Anterior circulation: Cavernous carotid widely patent bilaterally.
Hypoplastic right A1 segment. Azygos anterior cerebral artery
supplied from the left with mild atherosclerotic disease. Mild
stenosis right M2 branch inferior division. M1 segments widely
patent bilaterally. Left middle cerebral artery branches widely
patent.

Posterior circulation: Both vertebral arteries patent to the
basilar. Left PICA patent. AICA patent bilaterally. Basilar widely
patent. Superior cerebellar and posterior cerebral arteries patent
bilaterally. Fetal origin right posterior cerebral artery.

Venous sinuses: Normal venous enhancement

Anatomic variants: None

Review of the MIP images confirms the above findings
IMPRESSION: 1. Persistent hypodensity right medial occipital lobe may represent
a subacute infarct. No associated hemorrhage
2. Progression of subdural hygroma on the right likely related to
recent head trauma. No acute intracranial hemorrhage.
3. No significant carotid or vertebral artery in the neck.
4. Mild intracranial atherosclerotic disease. Negative for
significant stenosis or large vessel occlusion.

## 2020-05-28 SURGERY — ESOPHAGOGASTRODUODENOSCOPY (EGD) WITH PROPOFOL
Anesthesia: Monitor Anesthesia Care

## 2020-05-28 MED ORDER — IOHEXOL 350 MG/ML SOLN
75.0000 mL | Freq: Once | INTRAVENOUS | Status: AC | PRN
  Administered 2020-05-28: 75 mL via INTRAVENOUS

## 2020-05-28 MED ORDER — PROPOFOL 10 MG/ML IV BOLUS
INTRAVENOUS | Status: DC | PRN
  Administered 2020-05-28 (×3): 20 mg via INTRAVENOUS

## 2020-05-28 MED ORDER — LORAZEPAM 2 MG/ML IJ SOLN
1.0000 mg | Freq: Once | INTRAMUSCULAR | Status: AC
  Administered 2020-05-28: 1 mg via INTRAVENOUS
  Filled 2020-05-28: qty 1

## 2020-05-28 MED ORDER — GADOBUTROL 1 MMOL/ML IV SOLN
7.5000 mL | Freq: Once | INTRAVENOUS | Status: AC | PRN
  Administered 2020-05-28: 7.5 mL via INTRAVENOUS

## 2020-05-28 MED ORDER — LACTATED RINGERS IV SOLN: INTRAVENOUS | Status: DC | PRN

## 2020-05-28 MED ORDER — LACTATED RINGERS IV SOLN: INTRAVENOUS | Status: DC

## 2020-05-28 MED ORDER — PROPOFOL 500 MG/50ML IV EMUL
INTRAVENOUS | Status: DC | PRN
  Administered 2020-05-28: 100 ug/kg/min via INTRAVENOUS

## 2020-05-28 SURGICAL SUPPLY — 15 items

## 2020-05-28 NOTE — Anesthesia Preprocedure Evaluation (Addendum)
Anesthesia Evaluation  Patient identified by MRN, date of birth, ID band Patient awake    Reviewed: Allergy & Precautions, NPO status , Patient's Chart, lab work & pertinent test results  History of Anesthesia Complications Negative for: history of anesthetic complications  Airway Mallampati: IV  TM Distance: >3 FB Neck ROM: Full  Mouth opening: Limited Mouth Opening  Dental  (+) Dental Advisory Given, Teeth Intact   Pulmonary neg pulmonary ROS, neg shortness of breath, neg COPD, neg recent URI,  Covid-19 Nucleic Acid Test Results Lab Results      Component                Value               Date                      SARSCOV2NAA              NEGATIVE            05/28/2020              breath sounds clear to auscultation       Cardiovascular negative cardio ROS   Rhythm:Regular  1. Left ventricular ejection fraction, by estimation, is 60 to 65%. The  left ventricle has normal function. The left ventricle has no regional  wall motion abnormalities.  2. Right ventricular systolic function is normal. The right ventricular  size is normal.  3. The mitral valve is abnormal. Moderate mitral valve regurgitation. No  evidence of mitral stenosis.  4. The aortic valve is normal in structure. Aortic valve regurgitation is  not visualized. No aortic stenosis is present.  5. The inferior vena cava is normal in size with greater than 50%  respiratory variability, suggesting right atrial pressure of 3 mmHg.    Neuro/Psych PSYCHIATRIC DISORDERS Dementia negative neurological ROS     GI/Hepatic GERD  ,? GI bleed   Endo/Other  negative endocrine ROS  Renal/GU Renal diseaseLab Results      Component                Value               Date                      CREATININE               0.71                05/28/2020                Musculoskeletal   Abdominal   Peds  Hematology  (+) Blood dyscrasia, anemia , Lab Results       Component                Value               Date                      WBC                      11.7 (H)            05/28/2020                HGB  7.3 (L)             05/28/2020                HCT                      25.1 (L)            05/28/2020                MCV                      67.3 (L)            05/28/2020                PLT                      206                 05/28/2020              Anesthesia Other Findings   Reproductive/Obstetrics                            Anesthesia Physical Anesthesia Plan  ASA: III  Anesthesia Plan: MAC   Post-op Pain Management:    Induction: Intravenous  PONV Risk Score and Plan: 2 and Propofol infusion  Airway Management Planned: Nasal Cannula  Additional Equipment: None  Intra-op Plan:   Post-operative Plan:   Informed Consent: I have reviewed the patients History and Physical, chart, labs and discussed the procedure including the risks, benefits and alternatives for the proposed anesthesia with the patient or authorized representative who has indicated his/her understanding and acceptance.     Dental advisory given  Plan Discussed with: CRNA and Surgeon  Anesthesia Plan Comments:         Anesthesia Quick Evaluation

## 2020-05-28 NOTE — Transfer of Care (Signed)
Immediate Anesthesia Transfer of Care Note  Patient: Leslie Rios  Procedure(s) Performed: ESOPHAGOGASTRODUODENOSCOPY (EGD) WITH PROPOFOL (N/A )  Patient Location: PACU  Anesthesia Type:MAC  Level of Consciousness: drowsy  Airway & Oxygen Therapy: Patient Spontanous Breathing and Patient connected to nasal cannula oxygen  Post-op Assessment: Report given to RN and Post -op Vital signs reviewed and stable  Post vital signs: Reviewed and stable  Last Vitals:  Vitals Value Taken Time  BP 121/60 05/28/20 1450  Temp    Pulse 71 05/28/20 1450  Resp 11 05/28/20 1450  SpO2 99 % 05/28/20 1450  Vitals shown include unvalidated device data.  Last Pain:  Vitals:   05/28/20 1407  TempSrc: Temporal  PainSc: 0-No pain         Complications: No complications documented.

## 2020-05-28 NOTE — Progress Notes (Addendum)
Subjective:   Overnight, no acute events noted.   This AM, Leslie Rios denies any acute complaints at this time, including abdominal pain, nausea, vomiting, chest pain, shortness of breath.  Objective:  Vital signs in last 24 hours: Vitals:   05/27/20 1211 05/27/20 1700 05/27/20 2357 05/28/20 0535  BP: (!) 151/78 (!) 148/82 (!) 148/55 (!) 143/55  Pulse: 83 85 83 84  Resp: 16 16 18 18   Temp: 98.7 F (37.1 C) 98.4 F (36.9 C) 97.9 F (36.6 C) 98.2 F (36.8 C)  TempSrc: Oral Oral Oral Oral  SpO2: 95% 97% 94% 95%  Weight:        Intake/Output Summary (Last 24 hours) at 05/28/2020 0705 Last data filed at 05/28/2020 0544 Gross per 24 hour  Intake 956.9 ml  Output 450 ml  Net 506.9 ml   Filed Weights   05/27/20 0443  Weight: 69.1 kg  Physical Exam Vitals and nursing note reviewed.  Constitutional:      General: She is not in acute distress.    Appearance: She is not ill-appearing.  HENT:     Head: Normocephalic and atraumatic.  Cardiovascular:     Rate and Rhythm: Normal rate and regular rhythm.     Pulses: Normal pulses.     Heart sounds: Normal heart sounds.  Pulmonary:     Comments: Fine crackles in all lung fields bilaterally Abdominal:     General: Abdomen is flat. Bowel sounds are normal.     Palpations: Abdomen is soft.     Tenderness: There is no abdominal tenderness.  Musculoskeletal:     Right lower leg: No edema.     Left lower leg: No edema.  Skin:    General: Skin is warm and dry.     Capillary Refill: Capillary refill takes less than 2 seconds.  Neurological:     Mental Status: She is alert. Mental status is at baseline. She is disoriented.  Psychiatric:        Mood and Affect: Mood normal.        Behavior: Behavior normal.   Labs in last 24 hours: CMP Latest Ref Rng & Units 05/28/2020 05/27/2020 05/26/2020  Glucose 70 - 99 mg/dL 101(H) 103(H) 136(H)  BUN 8 - 23 mg/dL 8 11 12   Creatinine 0.44 - 1.00 mg/dL 0.71 0.87 1.02(H)  Sodium 135 - 145 mmol/L  134(L) 135 135  Potassium 3.5 - 5.1 mmol/L 3.8 3.3(L) 3.3(L)  Chloride 98 - 111 mmol/L 104 103 101  CO2 22 - 32 mmol/L 22 24 24   Calcium 8.9 - 10.3 mg/dL 8.6(L) 8.6(L) 8.8(L)  Total Protein 6.5 - 8.1 g/dL - 5.8(L) -  Total Bilirubin 0.3 - 1.2 mg/dL - 1.3(H) -  Alkaline Phos 38 - 126 U/L - 88 -  AST 15 - 41 U/L - 18 -  ALT 0 - 44 U/L - 15 -   Assessment/Plan:  Principal Problem:   Symptomatic anemia  Leslie Rios is a 80 year old woman with past medical history significant for alzheimer disease, iron deficiency anemia due to unknown etiology, GERD in setting of hiatal hernia and HLD who presented to [facility] on [facility] following a fall at home found to have significant acute on chronic microcytic anemia as well as a right occipital cortical CVA.  #Acute on chronic iron deficiency anemia #History of hiatal hernia with [name] erosions EGD planned for today to evaluate for source of bleed. Hemoglobin has remained stable without additional transfusions. She is s/p 2 units total  of pRBCs and 1 Ferraheme dose.   - Gastroenterology following; appreciate their recommendations - Follow up EGD results - Transfuse for Hb <7 - Pantoprazole 40mg  daily - CBC daily   #Right occipital subacute CVA, active #Bilateral trace subdural hygroma, active Patient's repeat MRI consistent with CT on admission. Patient is only noted to have mild left lateral gaze palsy, otherwise there are no visual field deficits or other neuro deficits.   - Neurology following, appreciate recommendations - MRI brain with and without pending - Hold anticoagulation or antiplatelets in setting of GI bleed - Add aspirin when GI bleed stabilized - PT/OT/SLP  #HLD, chronic Lipid panel reveals this condition to be appropriately controlled with LDL < 70.   - Continue home atorvastatin 20mg  daily  #Alzheimer's disease, chronic - Continue home memantine and donepezil  #Anxiety, chronic - Continue home lorazepam 0.5mg   twice daily - Continue home trazodone 50mg  daily - Continue home buspirone 5mg  twice daily  #Pulmonary crackles Diffuse fine crackles on examination, with chest x-ray negative.  No signs of hypervolemia.  She has been using 2L supplemental oxygen while admitted, however I suspect this is more for comfort than for actual hypoxia, as there is no hypoxic events documented.  Differential includes ILD.  Will defer work-up until acute anemia has improved.  #VTE ppx: SCDs #IVF: None #Code status: Full code #Bowel regimen: Miralax daily  Prior to Admission Living Arrangement: [facility] in [place] Anticipated Discharge Location: [facility] in [place] Barriers to Discharge: Continued medical management Dispo: Anticipated discharge in approximately 2-3 day(s).   Dr. Jose Persia Internal Medicine PGY-2  Pager: [phone] After 5pm on weekdays and 1pm on weekends: On Call pager [phone]  05/28/2020, 7:07 AM

## 2020-05-28 NOTE — Anesthesia Postprocedure Evaluation (Signed)
Anesthesia Post Note  Patient: Leslie Rios  Procedure(s) Performed: ESOPHAGOGASTRODUODENOSCOPY (EGD) WITH PROPOFOL (N/A )     Patient location during evaluation: Endoscopy Anesthesia Type: MAC Level of consciousness: patient cooperative and awake Pain management: pain level controlled Vital Signs Assessment: post-procedure vital signs reviewed and stable Respiratory status: spontaneous breathing, nonlabored ventilation, respiratory function stable and patient connected to nasal cannula oxygen Cardiovascular status: stable and blood pressure returned to baseline Postop Assessment: no apparent nausea or vomiting Anesthetic complications: no   No complications documented.  Last Vitals:  Vitals:   05/28/20 1507 05/28/20 1530  BP: 138/61 (!) 146/86  Pulse: 77 83  Resp: 19 18  Temp:  37 C  SpO2: 95% 98%    Last Pain:  Vitals:   05/28/20 2009  TempSrc:   PainSc: 0-No pain                 Alliya Marcon

## 2020-05-28 NOTE — Plan of Care (Addendum)
Per Pt's son, she had 2 doses of pfeizer vaccine.  Problem: Clinical Measurements: Goal: Ability to maintain clinical measurements within normal limits will improve Outcome: Progressing   Problem: Activity: Goal: Risk for activity intolerance will decrease Outcome: Progressing   Problem: Nutrition: Goal: Adequate nutrition will be maintained Outcome: Progressing   Problem: Coping: Goal: Level of anxiety will decrease Outcome: Progressing   Problem: Elimination: Goal: Will not experience complications related to bowel motility Outcome: Progressing   Problem: Pain Managment: Goal: General experience of comfort will improve Outcome: Progressing   Problem: Safety: Goal: Ability to remain free from injury will improve Outcome: Progressing   Problem: Skin Integrity: Goal: Risk for impaired skin integrity will decrease Outcome: Progressing

## 2020-05-28 NOTE — Interval H&P Note (Signed)
History and Physical Interval Note:  05/28/2020 2:03 PM  Leslie Rios  has presented today for surgery, with the diagnosis of Anemia. Positive FOBT.  The various methods of treatment have been discussed with the patient and family. After consideration of risks, benefits and other options for treatment, the patient has consented to  Procedure(s): ESOPHAGOGASTRODUODENOSCOPY (EGD) WITH PROPOFOL (N/A) as a surgical intervention.  The patient's history has been reviewed, patient examined, no change in status, stable for surgery.  I have reviewed the patient's chart and labs.  Questions were answered to the patient's satisfaction.     Nelida Meuse III

## 2020-05-28 NOTE — Progress Notes (Signed)
STROKE TEAM PROGRESS NOTE    Interval History  I personally reviewed history of presenting illness with the patient, electronic medical records and imaging films in PACS.  She presented with recurrent falls and severe symptomatic anemia for which she is undergoing work-up.  She denies any focal neurological symptoms suggestive of a stroke in the form of peripheral vision loss, slurred speech, double vision or extremity weakness or numbness.  CT scan and MRI showed a lesion in the right medial temporoparietal lobe of unclear significance.  It is barely weakly diffusion positive but more prominently seen on FLAIR and T2 images which likely represents T2 shine through rather than acute ischemia.        Pertinent Lab Work and Imaging     CT Head WO IV Contrast low-density in the right medial temporal region possibly subacute infarct   CT Angio Head and Neck W WO IV Contrast no large vessel stenosis or occlusion   MRI Brain WO IV Contrast suboptimal with only limited images showing weak diffusion positive lesion more prominently seen on the FLAIR and T2 images from the right suboccipital parietal region of questionable significance   Echocardiogram Complete normal ejection fraction.  No cardiac source of embolism   Physical Examination   Constitutional: Pleasant elderly Caucasian lady not in distress.   Cardiovascular: Normal RR Respiratory: No increased WOB   Mental status: She is awake alert oriented to time and place.  Diminished attention, registration and recall.  Follows simple one-step commands. Speech: Clear without aphasia or apraxia. Cranial nerves: Pupils equal reactive.  Fundi not visualized.  Extraocular movements full range without nystagmus.  Blinks to threat bilaterally.  Face is symmetric without weakness. Motor: Normal bulk and tone. No drift.                                        Sensory: Normal sensation bilaterally Coordination: Slow but accurate Reflexes: 1+  symmetric Gait: Not tested  NIHSS: 0  Assessment and Plan   [name] is a 80 y.o. female w/pmh of Alzheimer's who presents with recurrent falls and severe anemia being evaluated for the cause of bleeding.  CT scan and MRI show right suboccipital and medial temporal lesion of unclear etiology.  She has no clinical signs and symptoms to go with a stroke in this location.  Recommend check CT angiogram of brain and neck to look for intra and extracranial stenosis.  Repeat MRI scan of the brain with and without contrast as the previous study was suboptimal and incomplete and inconclusive.  Patient may need sedation with IV Ativan 1 mg at the time of MRI in order to cooperate for the study.  Continue endoscopy and work-up for source of anemia as per primary team.  Greater than 50% time during this 35-minute visit was spent on counseling and coordination of care and discussion with care team.      Hospital day # 0  Antony Contras, MD   To contact Stroke Continuity provider, please refer to [email or web address] After hours, contact General Neurology

## 2020-05-28 NOTE — Op Note (Signed)
[facility] Patient Name: Leslie Rios Procedure Date : 05/28/2020 MRN: [id number] Attending MD: Estill Cotta. Danis , MD Date of Birth: Jun 05, 1940 CSN: [id number] Age: 80 Admit Type: Inpatient Procedure:                Upper GI endoscopy Indications:              Iron deficiency anemia secondary to chronic blood                            loss, Heme positive stool (acute on chronic, years,                            prior endoscopic workup in [facility] system - details                            in 05/27/20 consult note). reported "Leslie Rios"                            erosions seen on prior upper endoscopic exam                           No overt bleeding Providers:                Mallie Mussel L. Loletha Carrow, MD, Clyde Lundborg, RN, Tyna Jaksch Technician Referring MD:             Internal Medicine Teaching Service Medicines:                Monitored Anesthesia Care Complications:            No immediate complications. Estimated Blood Loss:     Estimated blood loss: none. Procedure:                Pre-Anesthesia Assessment:                           - Prior to the procedure, a History and Physical                            was performed, and patient medications and                            allergies were reviewed. The patient's tolerance of                            previous anesthesia was also reviewed. The risks                            and benefits of the procedure and the sedation                            options and risks were discussed with the patient.  All questions were answered, and informed consent                            was obtained. Prior Anticoagulants: The patient has                            taken no previous anticoagulant or antiplatelet                            agents. ASA Grade Assessment: III - A patient with                            severe systemic disease. After reviewing the risks                             and benefits, the patient was deemed in                            satisfactory condition to undergo the procedure.                           After obtaining informed consent, the endoscope was                            passed under direct vision. Throughout the                            procedure, the patient's blood pressure, pulse, and                            oxygen saturations were monitored continuously. The                            GIF-H190 ([id number]) Olympus gastroscope was                            introduced through the mouth, and advanced to the                            third part of duodenum. The upper GI endoscopy was                            accomplished without difficulty. The patient                            tolerated the procedure well. Scope In: Scope Out: Findings:      An 8-10 cm hiatal hernia was present. No erosions were seen. one spot of       scant heme was seen.      The lower third of the esophagus was mildly tortuous (as a result of the       hernia)      Multiple small sessile fundic gland polyps were found in the gastric       fundus and in the gastric body.  The exam of the stomach was otherwise normal.      The examined duodenum was normal. Impression:               - 8 cm hiatal hernia.                           - Tortuous esophagus.                           - Multiple fundic gland polyps.                           - Normal examined duodenum.                           - No specimens collected.                           The IDA may still be from hiatal hernia-related                            gastric erosions, which are often intermittently                            present to cause this type of occult GI blood loss.                           No video capsule study has been done before. There                            is the possibility of more distal SB blood loss                            source. However, if so, it is beyond  reach of                            enteroscope (done last year at [facility]).                           An ingested video capsule would most likely be                            retained in the hernia sac. This patient's                            pharyngeal anatomy would make for challenging                            endscopic delivery of a capsule device.                           Considering all that as well as the patient's age                            and  condition, I recommend not pursing surgical                            repair of hiatal hernia and not doing vido capsule                            study unless this patient presents with overt GI                            bleeding (melena and dropping hemoglobin).                           She needs closer follow up with her hematologist                            (last seen Sept '21) to monitor Hgb and iron levels                            and give sufficient IV iron to keep up with occult                            GI blood losses and maintain a safer (even if not                            normal) hemoglobin. Recommendation:           - Return patient to hospital ward for ongoing care.                           - Resume regular diet.                           - Continue present medications.                           - If neurology strongly recommends aspirin for                            acute CVA (though appears to have been largely                            triggered by severe anemia ), the bleeding risk is                            unknown without identifiable source on this exam. Procedure Code(s):        --- Professional ---                           [phone], Esophagogastroduodenoscopy, flexible,                            transoral; diagnostic, including collection of  specimen(s) by brushing or washing, when performed                            (separate procedure) Diagnosis Code(s):         --- Professional ---                           K44.9, Diaphragmatic hernia without obstruction or                            gangrene                           Q39.9, Congenital malformation of esophagus,                            unspecified                           K31.7, Polyp of stomach and duodenum                           D50.0, Iron deficiency anemia secondary to blood                            loss (chronic)                           R19.5, Other fecal abnormalities CPT copyright 2019 [employer] Medical Association. All rights reserved. The codes documented in this report are preliminary and upon coder review may  be revised to meet current compliance requirements. Derriona Branscom L. Loletha Carrow, MD 05/28/2020 2:57:30 PM This report has been signed electronically. Number of Addenda: 0

## 2020-05-28 NOTE — Progress Notes (Signed)
Received Pt on wheelchair, assisted to bed, vital signs taken and recorded. Pt is alert and oriented to self, place and situation, not in distress, son and daughter at bedside.

## 2020-05-29 ENCOUNTER — Inpatient Hospital Stay (HOSPITAL_COMMUNITY): Payer: Medicare HMO

## 2020-05-29 ENCOUNTER — Encounter (HOSPITAL_COMMUNITY): Payer: Self-pay | Admitting: Gastroenterology

## 2020-05-29 DIAGNOSIS — J810 Acute pulmonary edema: Secondary | ICD-10-CM | POA: Diagnosis not present

## 2020-05-29 DIAGNOSIS — S065X9A Traumatic subdural hemorrhage with loss of consciousness of unspecified duration, initial encounter: Secondary | ICD-10-CM

## 2020-05-29 DIAGNOSIS — S065XAA Traumatic subdural hemorrhage with loss of consciousness status unknown, initial encounter: Secondary | ICD-10-CM

## 2020-05-29 DIAGNOSIS — D649 Anemia, unspecified: Secondary | ICD-10-CM | POA: Diagnosis not present

## 2020-05-29 DIAGNOSIS — R0902 Hypoxemia: Secondary | ICD-10-CM | POA: Diagnosis not present

## 2020-05-29 LAB — BASIC METABOLIC PANEL
Anion gap: 7 (ref 5–15)
BUN: 8 mg/dL (ref 8–23)
CO2: 26 mmol/L (ref 22–32)
Calcium: 8.7 mg/dL — ABNORMAL LOW (ref 8.9–10.3)
Chloride: 104 mmol/L (ref 98–111)
Creatinine, Ser: 0.79 mg/dL (ref 0.44–1.00)
GFR, Estimated: >60 mL/min (ref >60)
Glucose, Bld: 94 mg/dL (ref 70–99)
Potassium: 3.5 mmol/L (ref 3.5–5.1)
Sodium: 137 mmol/L (ref 135–145)

## 2020-05-29 LAB — CBC
HCT: 26.7 % — ABNORMAL LOW (ref 36.0–46.0)
Hemoglobin: 7.8 g/dL — ABNORMAL LOW (ref 12.0–15.0)
MCH: 20.6 pg — ABNORMAL LOW (ref 26.0–34.0)
MCHC: 29.2 g/dL — ABNORMAL LOW (ref 30.0–36.0)
MCV: 70.4 fL — ABNORMAL LOW (ref 80.0–100.0)
Platelets: 203 10*3/uL (ref 150–400)
RBC: 3.79 MIL/uL — ABNORMAL LOW (ref 3.87–5.11)
RDW: 32.2 % — ABNORMAL HIGH (ref 11.5–15.5)
WBC: 10.6 10*3/uL — ABNORMAL HIGH (ref 4.0–10.5)
nRBC: 0.8 % — ABNORMAL HIGH (ref 0.0–0.2)

## 2020-05-29 IMAGING — CT CT CHEST HIGH RESOLUTION W/O CM
2 of 7 series · 14 of 36 positions shown, 17 images · non-contrast
Comparison: None.

CLINICAL DATA: Hypoxemia

EXAM:
CT CHEST WITHOUT CONTRAST
TECHNIQUE: Multidetector CT imaging of the chest was performed following the
standard protocol without intravenous contrast. High resolution
imaging of the lungs, as well as inspiratory and expiratory imaging,
was performed.

[Series 6: coronals · coronal · 0.51mm/px · 3 of 128 slices shown]
[im 26/128  lung]
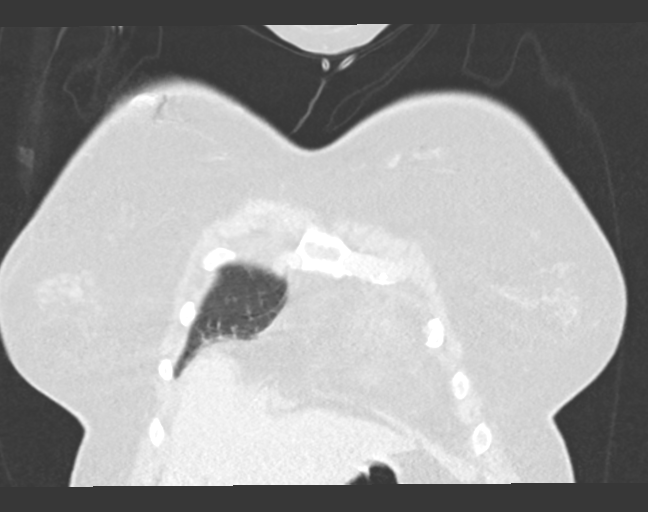
[im 51/128  lung]
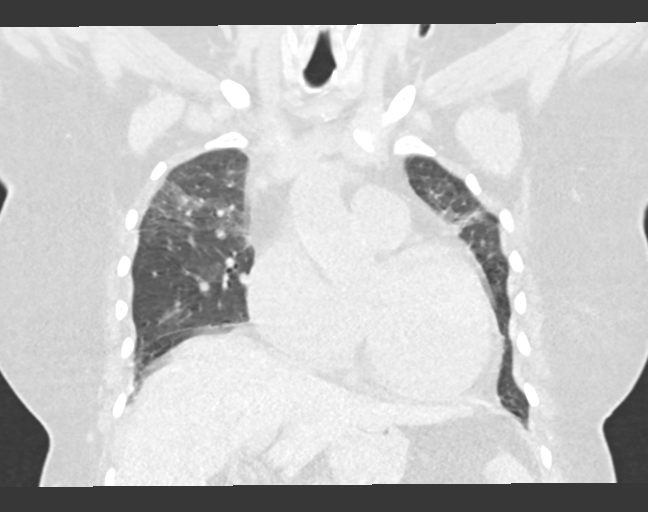
[im 77/128  lung]
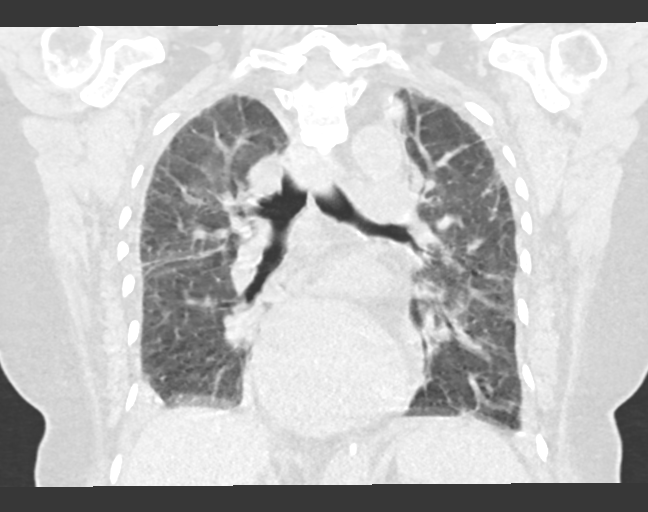

[Series 9: hi res thins · axial · 0.67mm/px · z∈[-60,+136]mm · 11 of 236 slices shown, 14 images]
[im 20/236  mediastinal]
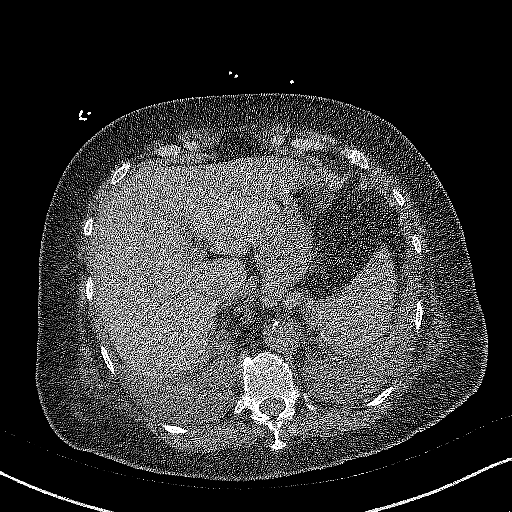
[im 20/236  lung]
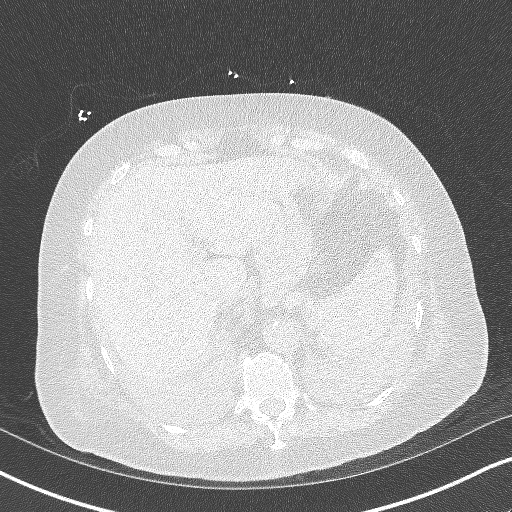
[im 40/236  lung]
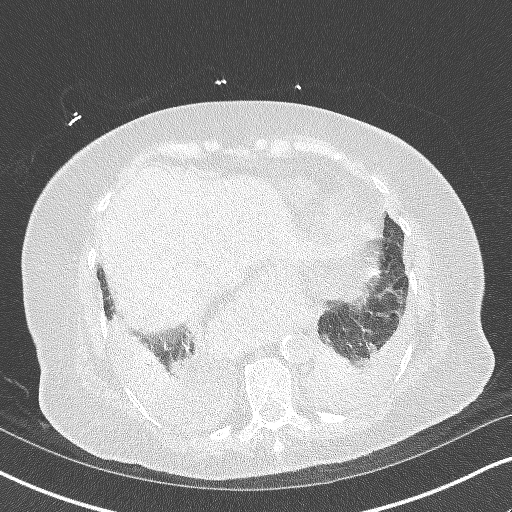
[im 59/236  lung]
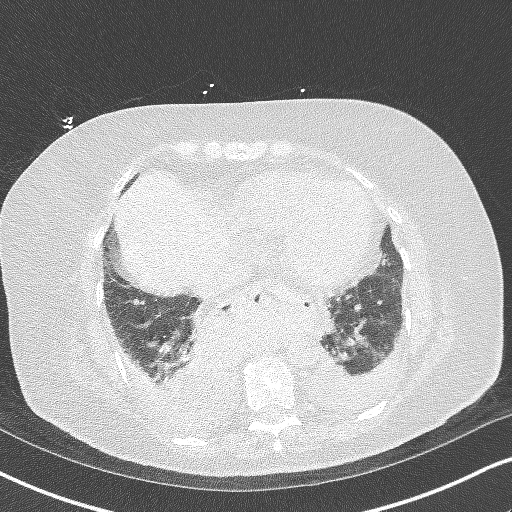
[im 79/236  lung]
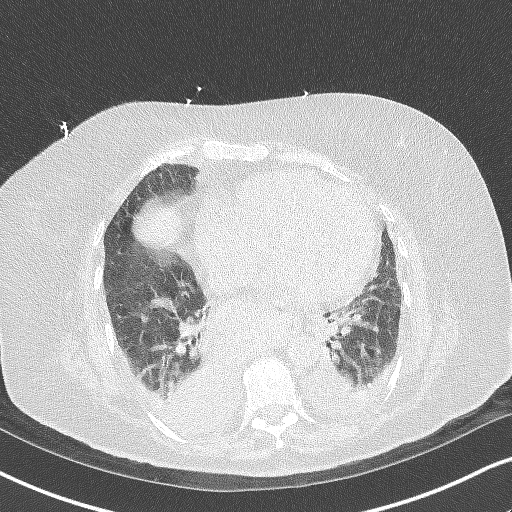
[im 98/236  mediastinal]
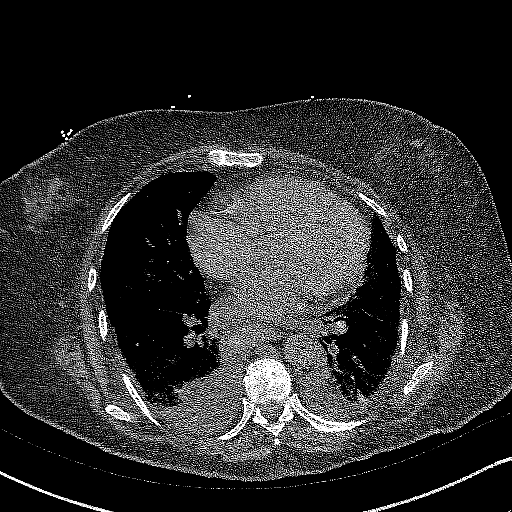
[im 98/236  lung]
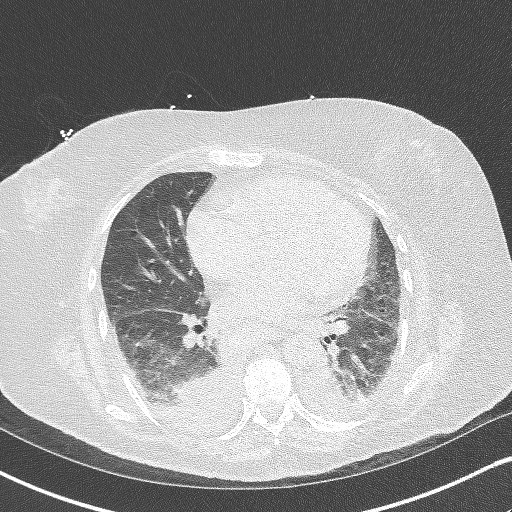
[im 118/236  lung]
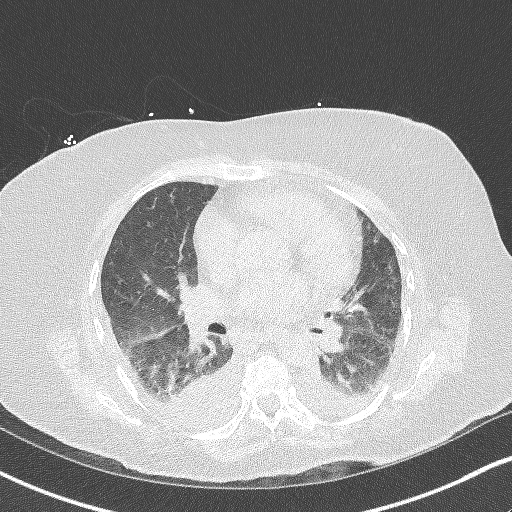
[im 138/236  lung]
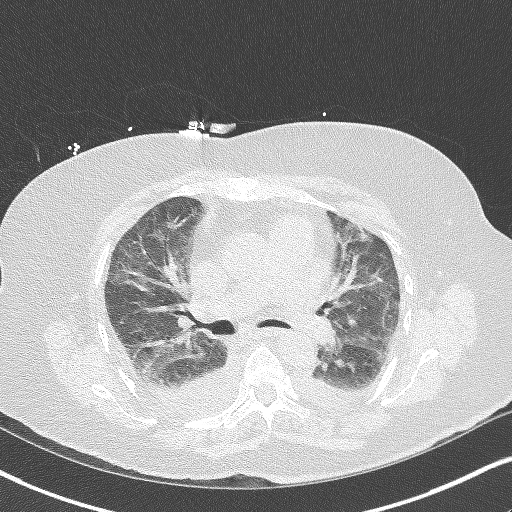
[im 157/236  lung]
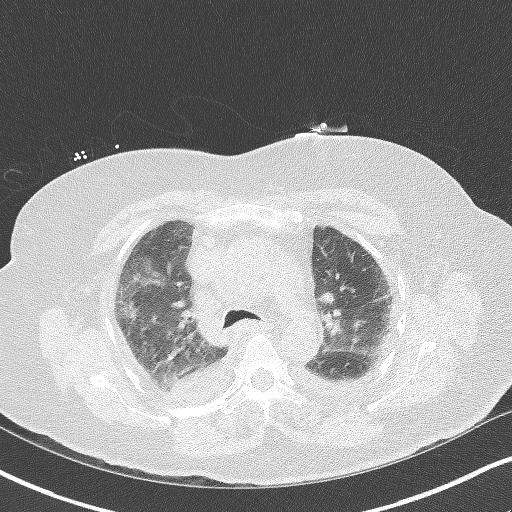
[im 177/236  mediastinal]
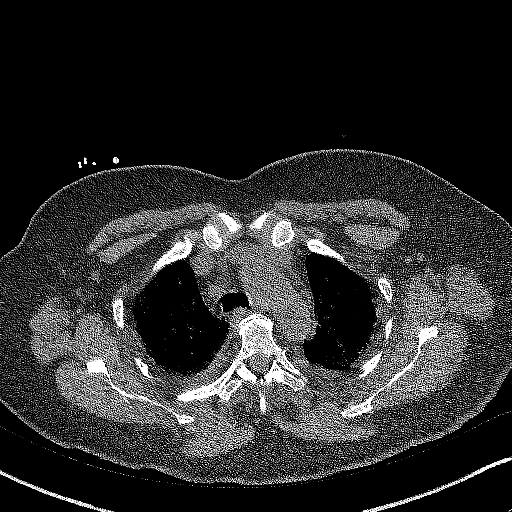
[im 177/236  lung]
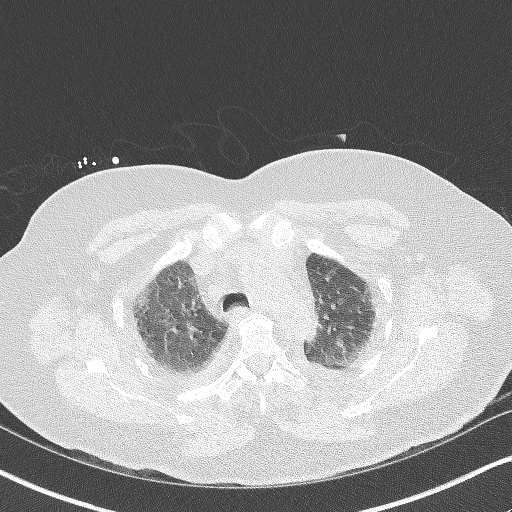
[im 196/236  lung]
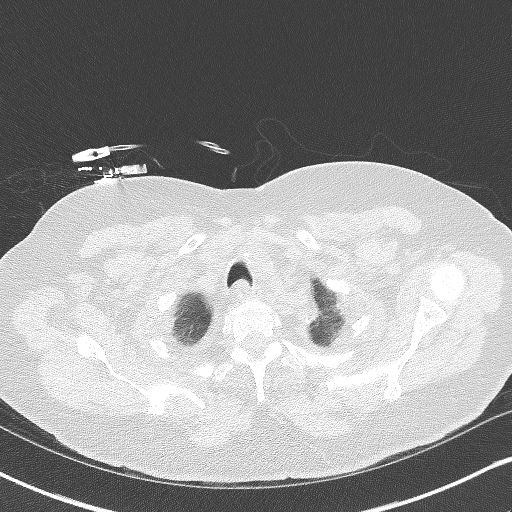
[im 216/236  lung]
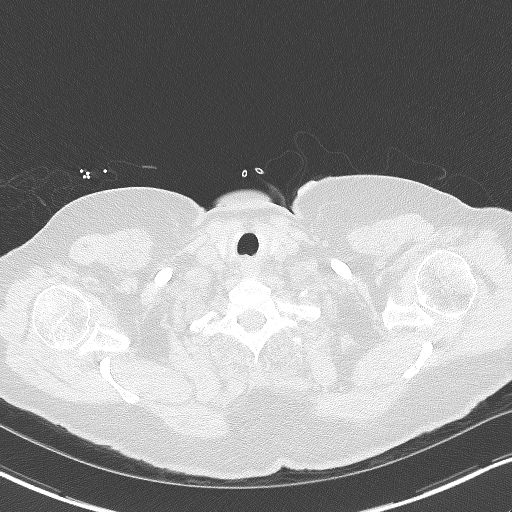

[14 of 36 positions shown; findings below may reference images not displayed]

FINDINGS: Cardiovascular: Scattered aortic atherosclerosis. Normal heart size.
Scattered three-vessel coronary artery calcifications no pericardial
effusion.

Mediastinum/Nodes: Prominent mediastinal lymph nodes. Large hiatal
hernia with intrathoracic position of the gastric body and fundus.
Thyroid gland, trachea, and esophagus demonstrate no significant
findings.

Lungs/Pleura: Small to moderate bilateral pleural effusions with
associated atelectasis or consolidation. Examination of the lung
parenchyma is significantly limited by breath motion artifact
throughout. Within this limitation, there is irregular ground-glass
airspace opacity and septal thickening. No significant air trapping
on expiratory phase imaging.

Upper Abdomen: No acute abnormality.

Musculoskeletal: No chest wall mass or suspicious bone lesions
identified.
IMPRESSION: 1. Small to moderate bilateral pleural effusions with associated
atelectasis or consolidation.
2. Examination of the lung parenchyma is significantly limited by
breath motion artifact throughout and the presence of pleural
effusions. Within this limitation, there is irregular ground-glass
airspace opacity and septal thickening throughout the lungs, likely
reflecting atypical infection and edema. Underlying fibrotic
interstitial lung disease is not excluded on the basis of this
examination. Consider follow-up CT at the resolution of acute
clinical presentation and pleural effusions to assess for
interstitial lung disease if there is high persistent clinical
concern.
3. Large hiatal hernia with intrathoracic position of the gastric
body and fundus.
4. Coronary artery disease.

Aortic Atherosclerosis ([1E]-[1E]).

## 2020-05-29 NOTE — Discharge Summary (Addendum)
Name: Leslie Rios MRN: [id number] DOB: 01-06-1941 80 y.o. PCP: Pcp, No  Date of Admission: 05/26/2020 10:08 AM Date of Discharge: 05/31/2020 Attending Physician: Velna Ochs, MD  Discharge Diagnosis: 1. Principal Problem:   Symptomatic anemia Active Problems:   Subdural hematoma (HCC)   Hypoxia   Acute pulmonary edema (HCC)  Discharge Medications: Allergies as of 05/31/2020      Reactions   Sulfa Antibiotics Rash   Sulfamethizole       Medication List    STOP taking these medications   HYDROcodone-acetaminophen 5-325 MG tablet Commonly known as: NORCO/VICODIN     TAKE these medications   atorvastatin 20 MG tablet Commonly known as: LIPITOR Take 10 mg by mouth at bedtime.   busPIRone 5 MG tablet Commonly known as: BUSPAR Take 5 mg by mouth 2 (two) times daily.   clotrimazole-betamethasone cream Commonly known as: LOTRISONE Apply 1 application topically 2 (two) times daily. Apply to yeast rash at groin.   furosemide 20 MG tablet Commonly known as: LASIX Take 2 tablets (40 mg total) by mouth daily for 2 days, THEN 1 tablet (20 mg total) daily for 28 days. Start taking on: May 31, 2020 What changed: See the new instructions.   LORazepam 0.5 MG tablet Commonly known as: ATIVAN Take 0.5 mg by mouth 2 (two) times daily as needed for anxiety.   Myrbetriq 25 MG Tb24 tablet Generic drug: mirabegron ER Take 25 mg by mouth daily.   Namzaric 28-10 MG Cp24 Generic drug: Memantine HCl-Donepezil HCl Take 1 capsule by mouth at bedtime.   ondansetron 4 MG tablet Commonly known as: ZOFRAN Take 4 mg by mouth as needed for nausea/vomiting.   pantoprazole 40 MG tablet Commonly known as: PROTONIX Take 40 mg by mouth daily.   traZODone 50 MG tablet Commonly known as: DESYREL Take 50 mg by mouth at bedtime.   venlafaxine XR 150 MG 24 hr capsule Commonly known as: EFFEXOR-XR Take 150 mg by mouth daily.      Disposition and follow-up:   Leslie Rios  was discharged from [facility] in [place] condition.  At the hospital follow up visit please address:  1.  Iron deficiency anemia: Patient presented with hemoglobin of 4.0. GI workup most concerning for large, 8cm hiatal hernia, with likely chronic blood loss. Hemoglobin on discharge of 9.0. Patient will require very close monitoring of her hemoglobin and iron with plan for continued blood and iron transfusions as needed. Please ensure patient has follow-up with gastroenterology and hematology.  Hypoxia: Patient noted to have desaturations with ambulation requiring 2L oxygen via nasal cannula to maintain SpO2 >88% early during admission. High resolution CT revealed bilateral small pleural effusions and irregular ground glass opacities concerning for edema or infiltrates. Patient treated with IV lasix for one dose with plan to take furosemide 40mg  for two days then resume 20mg  daily. On day of discharge, patient able to maintain SpO2 >88% with ambulation which disqualifies her from discharging with supplemental oxygen. Please evaluate patient's hypoxia and determine need for oxygen. If patient continues to not improve, please consider trial of azithromycin versus further workup for chronic interstitial lung disease.  Bilateral subdural hematomas: Patient presented following a fall with initial imaging concerning for subacute occipital CVA, however further imaging most consistent with contusion with resultant bilateral subdural hematomas. Patient should be monitored for symptoms including worsening headache or the development of neurological deficits.  2.  Labs / imaging needed at time of follow-up: CBC, BMP  3.  Pending labs/ test needing follow-up: None  Follow-up Appointments:  Follow-up Information    Philis Nettle, PA-C. Schedule an appointment as soon as possible for a visit in 1 week(s).   Specialty: Physician Assistant Contact information: [address]  [place] [address] [phone]        Merian Capron, MD. Schedule an appointment as soon as possible for a visit in 1 week(s).   Specialty: Unknown Physician Specialty Contact information: [address] [place] [address] [phone]        Grier Mitts, MD. Schedule an appointment as soon as possible for a visit in 1 week(s).   Specialty: Internal Medicine Contact information: [place] [address] [phone]        [facility], [facility] Follow up.   Specialty: Home Health Services Contact information: [place] [address] [phone]              Vitals: Vitals:   05/31/20 0202 05/31/20 0550  BP: 136/64 132/66  Pulse: 67 77  Resp: 18 18  Temp: 98 F (36.7 C) 98.4 F (36.9 C)  SpO2: 95% 96%   Physical Examination: General - Comfortable appearing woman supine in hospital bed in no acute distress Respiratory - Fine crackles in middle and lower lung fields worse on the left than the right Cardiovascular - Normal rate, regular rhythm no murmurs rubs or gallops MSK - No edema, full range of motion. Skin - Warm and dry Psych - appropriate mood and affect  Hospital Course by problem list:  #Acute on chronic iron deficiency anemia #8cm hiatal hernia Patient presented following a fall and was found to have a hemoglobin of 4.0. Iron studies collected revealed that patient had a ferritin of 7. Patient underwent two units of packed red blood cell transfusion with improvement of her hemoglobin to greater than 7. Patient also received one dose of IV feraheme. Gastroenterology was consulted who performed an EGD which revealed an 8cm hiatal hernia, tortuous esophagus and multiple fundic gland polyps. Gastroenterology recommended no further diagnostic evaluation or surgical management of her large hiatal hernia. Gastroenterology signed off with the recommendation for regular diet,  pantoprazole 40mg  daily and close follow-up with GI and hematology. Patient will need very close monitoring of hemoglobin and iron levels to determine need for blood transfusions and iron transfusions. Patient's hemoglobin 9.0 on day of discharge.  #Bilateral subdural hematomas, active Patient presented following a fall and was found to have findings on CT concerning for subacute right occipital subacute CVA. Neurology was subsequently consulted with recommendation for patient to obtain CTA Head and Neck as well as MRI with and without contrast. Repeat imaging was most consistent with bilateral subdural hematomas and her initial finding concerning for a subacute CVA was thought to be more likely a contusion. Patient was determined to not require aspirin despite initial concern for stroke. She had no further neurologic deficits requiring attention.  #Hypoxia Patient noted to have diffuse crackles on lung auscultation. She was able to maintain oxygen saturation on room air, however she was placed on supplemental oxygen for comfort. Patient denied cough or shortness of breath throughout hospitalization. Chest x-ray was obtained which was unremarkable. Interstitial lung disease was high on differential, therefore high resolution CT of the chest obtained which revealed bilateral pleural effusions and irregular ground glass opacities concerning for edema versus atypical infection. Patient received IV lasix 40mg  and was able to maintain  SpO2 while at rest, however due to desaturation with ambulation patient was initially planned to discharge with oxygen. Upon day of discharge, patient re-evaluated and able to maintain SpO2 >88% with ambulation and thus discharged without oxygen. Plan for patient to take increased dose of home furosemide for two days followed by re-evaluation with physician at [facility] with plan to obtain further workup versus treat atypical infection if unimproved.  #HLD Lipid panel reveals  this condition to be appropriately controlled with LDL < 70. Patient continued on home atorvastatin throughout hospitalization.  #Alzheimer's disease, chronic Patient continued home memantine and donepezil  #Anxiety, chronic Patient continued home lorazepam, trazodone and buspirone.  Pertinent Labs, Studies, and Procedures:  CBC Latest Ref Rng & Units 05/31/2020 05/30/2020 05/29/2020  WBC 4.0 - 10.5 K/uL 9.5 10.0 10.6(H)  Hemoglobin 12.0 - 15.0 g/dL 9.0(L) 7.8(L) 7.8(L)  Hematocrit 36.0 - 46.0 % 31.3(L) 27.3(L) 26.7(L)  Platelets 150 - 400 K/uL 229 203 203   CMP Latest Ref Rng & Units 05/31/2020 05/30/2020 05/29/2020  Glucose 70 - 99 mg/dL 83 86 94  BUN 8 - 23 mg/dL 10 8 8   Creatinine 0.44 - 1.00 mg/dL 0.88 0.80 0.79  Sodium 135 - 145 mmol/L 136 133(L) 137  Potassium 3.5 - 5.1 mmol/L 3.5 3.6 3.5  Chloride 98 - 111 mmol/L 100 102 104  CO2 22 - 32 mmol/L 28 26 26   Calcium 8.9 - 10.3 mg/dL 9.0 8.5(L) 8.7(L)  Total Protein 6.5 - 8.1 g/dL - - -  Total Bilirubin 0.3 - 1.2 mg/dL - - -  Alkaline Phos 38 - 126 U/L - - -  AST 15 - 41 U/L - - -  ALT 0 - 44 U/L - - -  CT ANGIO HEAD W OR WO CONTRAST  Result Date: 05/28/2020 CLINICAL DATA:  Stroke/TIA.  Recent head trauma EXAM: CT ANGIOGRAPHY HEAD AND NECK TECHNIQUE: Multidetector CT imaging of the head and neck was performed using the standard protocol during bolus administration of intravenous contrast. Multiplanar CT image reconstructions and MIPs were obtained to evaluate the vascular anatomy. Carotid stenosis measurements (when applicable) are obtained utilizing NASCET criteria, using the distal internal carotid diameter as the denominator. CONTRAST:  7mL OMNIPAQUE IOHEXOL 350 MG/ML SOLN COMPARISON:  CT head MRI head 05/26/2020 FINDINGS: CT HEAD FINDINGS Brain: Small hypodensity right medial occipital lobe unchanged from the prior CT. Possible subacute infarct based on MRI. No associated hemorrhage. Progression of extra-axial subdural hygroma on  the right now measuring approximately 7 mm in thickness. Mild septal deviation to the left is unchanged. Vascular: Negative for hyperdense vessel Skull: Negative for fracture. Skin staples posteriorly on the right. Sinuses: Mild mucosal edema paranasal sinuses. Extensive septal deviation to the right. Orbits: Negative Review of the MIP images confirms the above findings CTA NECK FINDINGS Aortic arch: Standard branching. Imaged portion shows no evidence of aneurysm or dissection. No significant stenosis of the major arch vessel origins. Right carotid system: No significant stenosis. Noncalcified plaque right carotid bulb. Left carotid system: Atherosclerotic calcification left carotid bulb without significant stenosis. Negative for dissection. Vertebral arteries: Left vertebral artery dominant. Both vertebral arteries patent to the basilar without stenosis. Skeleton: Cervical spondylosis.  No acute abnormality. Other neck: Negative Upper chest: Bilateral pleural effusions. Patchy airspace disease right upper lobe, incompletely evaluated. Review of the MIP images confirms the above findings CTA HEAD FINDINGS Anterior circulation: Cavernous carotid widely patent bilaterally. Hypoplastic right A1 segment. Azygos anterior cerebral artery supplied from the left with mild atherosclerotic disease. Mild  stenosis right M2 branch inferior division. M1 segments widely patent bilaterally. Left middle cerebral artery branches widely patent. Posterior circulation: Both vertebral arteries patent to the basilar. Left PICA patent. AICA patent bilaterally. Basilar widely patent. Superior cerebellar and posterior cerebral arteries patent bilaterally. Fetal origin right posterior cerebral artery. Venous sinuses: Normal venous enhancement Anatomic variants: None Review of the MIP images confirms the above findings IMPRESSION: 1. Persistent hypodensity right medial occipital lobe may represent a subacute infarct. No associated hemorrhage  2. Progression of subdural hygroma on the right likely related to recent head trauma. No acute intracranial hemorrhage. 3. No significant carotid or vertebral artery in the neck. 4. Mild intracranial atherosclerotic disease. Negative for significant stenosis or large vessel occlusion. Electronically Signed   By: Franchot Gallo M.D.   On: 05/28/2020 12:18   CT ANGIO NECK W OR WO CONTRAST  Result Date: 05/28/2020 CLINICAL DATA:  Stroke/TIA.  Recent head trauma EXAM: CT ANGIOGRAPHY HEAD AND NECK TECHNIQUE: Multidetector CT imaging of the head and neck was performed using the standard protocol during bolus administration of intravenous contrast. Multiplanar CT image reconstructions and MIPs were obtained to evaluate the vascular anatomy. Carotid stenosis measurements (when applicable) are obtained utilizing NASCET criteria, using the distal internal carotid diameter as the denominator. CONTRAST:  25mL OMNIPAQUE IOHEXOL 350 MG/ML SOLN COMPARISON:  CT head MRI head 05/26/2020 FINDINGS: CT HEAD FINDINGS Brain: Small hypodensity right medial occipital lobe unchanged from the prior CT. Possible subacute infarct based on MRI. No associated hemorrhage. Progression of extra-axial subdural hygroma on the right now measuring approximately 7 mm in thickness. Mild septal deviation to the left is unchanged. Vascular: Negative for hyperdense vessel Skull: Negative for fracture. Skin staples posteriorly on the right. Sinuses: Mild mucosal edema paranasal sinuses. Extensive septal deviation to the right. Orbits: Negative Review of the MIP images confirms the above findings CTA NECK FINDINGS Aortic arch: Standard branching. Imaged portion shows no evidence of aneurysm or dissection. No significant stenosis of the major arch vessel origins. Right carotid system: No significant stenosis. Noncalcified plaque right carotid bulb. Left carotid system: Atherosclerotic calcification left carotid bulb without significant stenosis. Negative  for dissection. Vertebral arteries: Left vertebral artery dominant. Both vertebral arteries patent to the basilar without stenosis. Skeleton: Cervical spondylosis.  No acute abnormality. Other neck: Negative Upper chest: Bilateral pleural effusions. Patchy airspace disease right upper lobe, incompletely evaluated. Review of the MIP images confirms the above findings CTA HEAD FINDINGS Anterior circulation: Cavernous carotid widely patent bilaterally. Hypoplastic right A1 segment. Azygos anterior cerebral artery supplied from the left with mild atherosclerotic disease. Mild stenosis right M2 branch inferior division. M1 segments widely patent bilaterally. Left middle cerebral artery branches widely patent. Posterior circulation: Both vertebral arteries patent to the basilar. Left PICA patent. AICA patent bilaterally. Basilar widely patent. Superior cerebellar and posterior cerebral arteries patent bilaterally. Fetal origin right posterior cerebral artery. Venous sinuses: Normal venous enhancement Anatomic variants: None Review of the MIP images confirms the above findings IMPRESSION: 1. Persistent hypodensity right medial occipital lobe may represent a subacute infarct. No associated hemorrhage 2. Progression of subdural hygroma on the right likely related to recent head trauma. No acute intracranial hemorrhage. 3. No significant carotid or vertebral artery in the neck. 4. Mild intracranial atherosclerotic disease. Negative for significant stenosis or large vessel occlusion. Electronically Signed   By: Franchot Gallo M.D.   On: 05/28/2020 12:18   MR BRAIN W WO CONTRAST  Result Date: 05/28/2020 CLINICAL DATA:  Stroke follow-up.  History of fall.  Confusion. EXAM: MRI HEAD WITHOUT AND WITH CONTRAST TECHNIQUE: Multiplanar, multiecho pulse sequences of the brain and surrounding structures were obtained without and with intravenous contrast. CONTRAST:  7.32mL GADAVIST GADOBUTROL 1 MMOL/ML IV SOLN COMPARISON:  CT angio  head and neck 05/28/2020.  MRI head 05/26/2020 FINDINGS: Brain: 7 mm CSF density extra-axial fluid collection on the right unchanged from the CT today but progressive since the prior MRI. This contains mild increased signal on FLAIR suggesting a small amount of associated hemorrhage. This is low density on CT. Interhemispheric subdural hematoma to the left of midline has developed since the prior MRI. This measures approximately 4 mm in thickness. This is seen on T1 and FLAIR is hyperintensity. This is low density on CT. Small subdural hematoma over the left convexity. 1.5 x 2.5 cm lesion in the right inferior occipital lobe best seen on FLAIR and unchanged from the prior MRI. There is mild associated hyperintensity on T1. See sagittal T1 image 9 series 18 and postcontrast sagittal image 9. Susceptibility weighted imaging of poor quality. Ventricle size normal. Slight midline shift to the left. Negative for acute infarct. Diffusion-weighted imaging suboptimal in quality. Negative for mass lesion. Vascular: Normal arterial flow voids. Skull and upper cervical spine: Negative Sinuses/Orbits: Negative Other: None IMPRESSION: Negative for acute infarct Compared with the MRI of 05/26/2020, interval development of subdural hematomas bilaterally as well as in the interhemispheric fissure. These appear to be CSF density on CT however there is evidence of hemorrhage on MRI. Findings compatible with recent head injury. 1.5 x 2.5 cm hyperintensity in the right inferior occipital lobe best seen on FLAIR. Mild hyperintensity on T1. No abnormal enhancement. No restricted diffusion. No change from prior MRI. Possible subacute infarct with hemorrhage. Electronically Signed   By: Franchot Gallo M.D.   On: 05/28/2020 17:15   CT Chest High Resolution  Result Date: 05/29/2020 CLINICAL DATA:  Hypoxemia EXAM: CT CHEST WITHOUT CONTRAST TECHNIQUE: Multidetector CT imaging of the chest was performed following the standard protocol  without intravenous contrast. High resolution imaging of the lungs, as well as inspiratory and expiratory imaging, was performed. COMPARISON:  None. FINDINGS: Cardiovascular: Scattered aortic atherosclerosis. Normal heart size. Scattered three-vessel coronary artery calcifications no pericardial effusion. Mediastinum/Nodes: Prominent mediastinal lymph nodes. Large hiatal hernia with intrathoracic position of the gastric body and fundus. Thyroid gland, trachea, and esophagus demonstrate no significant findings. Lungs/Pleura: Small to moderate bilateral pleural effusions with associated atelectasis or consolidation. Examination of the lung parenchyma is significantly limited by breath motion artifact throughout. Within this limitation, there is irregular ground-glass airspace opacity and septal thickening. No significant air trapping on expiratory phase imaging. Upper Abdomen: No acute abnormality. Musculoskeletal: No chest wall mass or suspicious bone lesions identified. IMPRESSION: 1. Small to moderate bilateral pleural effusions with associated atelectasis or consolidation. 2. Examination of the lung parenchyma is significantly limited by breath motion artifact throughout and the presence of pleural effusions. Within this limitation, there is irregular ground-glass airspace opacity and septal thickening throughout the lungs, likely reflecting atypical infection and edema. Underlying fibrotic interstitial lung disease is not excluded on the basis of this examination. Consider follow-up CT at the resolution of acute clinical presentation and pleural effusions to assess for interstitial lung disease if there is high persistent clinical concern. 3. Large hiatal hernia with intrathoracic position of the gastric body and fundus. 4. Coronary artery disease. Aortic Atherosclerosis (ICD10-I70.0). Electronically Signed   By: Eddie Candle M.D.   On: 05/29/2020 20:31  EEG adult  Result Date: 05/29/2020 Plancher, Baron Sane,  MD     05/29/2020  3:39 PM PROCEDURE: EEG STAT 22 minutes with video study  DATES OF TEST: 05/29/2020  REASON FOR TEST: acute encephalopathy  AED/Sedative MEDICATIONS: n/a  TECHNIQUE: This is an 18 channel digital EEG recording using the standard international 10/20 system of electrode placement with one channel EKG recording. Pt was awake and drowsy during this recording.  FINDINGS: Background rhythm: symmetric diffuse 5-7 Hz, multiple myogenic artifact. Amplitude : normal Continuity: continuous Breach effect:  NO Variability: YES Reactivity: YES Rhythmic delta activity: NO Periodic discharges: NO Sporadic epileptiform discharges: NO Electrographic/electroclinical seizures: NO  Limited EKG reveals no abnormalities.   IMPRESSION: This is an abnormal study due to - 1. Mild Generalized slowing is a non-specific finding consistent with a generalized disturbance of cerebral functioning including toxic, metabolic, or structural abnormalities that are multi-focal or diffuse. 2. No definite epileptiform discharges or electrographic seizures noted.    Discharge Instructions: Discharge Instructions    Call MD for:  difficulty breathing, headache or visual disturbances   Complete by: As directed    Call MD for:  extreme fatigue   Complete by: As directed    Call MD for:  persistant dizziness or light-headedness   Complete by: As directed    Call MD for:  persistant nausea and vomiting   Complete by: As directed    Call MD for:  severe uncontrolled pain   Complete by: As directed    Call MD for:  temperature >100.4   Complete by: As directed    Diet - low sodium heart healthy   Complete by: As directed    Discharge instructions   Complete by: As directed    Ms. Rodman,  It was an absolute pleasure having the opportunity to take care of you during your hospitalization.  For your iron deficiency anemia: this is likely due to blood loss through your gastrointestinal tract. It will be very important that you  follow-up closely with your gastroenterologist and hematologist following discharge to have your blood levels monitored and to get iron and blood transfusions as needed.  For your fall and concern for stroke: you don't have a stroke but you likely have a bruise to your brain and some blood built up around your brain. It will be important to monitor for signs of worsening headache.  For your breathing: it looks like you needed oxygen when you were up and moving around. We plan to increase your home furosemide (lasix) dose to help remove any excess fluid built up around your lungs. Please speak closely with your doctor's to discuss your breathing and need for oxygen.  Sincerely, Dr. Paulla Dolly, MD   Increase activity slowly   Complete by: As directed      Signed: Cato Mulligan, MD 05/31/2020, 11:12 AM   Pager: [phone]

## 2020-05-29 NOTE — Progress Notes (Signed)
EEG complete - results pending 

## 2020-05-29 NOTE — TOC Initial Note (Addendum)
Transition of Care [facility]) - Initial/Assessment Note    Patient Details  Name: AIRA SALLADE MRN: [id number] Date of Birth: 1940/06/29  Transition of Care [facility]) CM/SW Contact:    Marilu Favre, RN Phone Number: 05/29/2020, 3:03 PM  Clinical Narrative:                 Received a message daughter Ronnell Guadalajara would like her mother to return to [facility] in [place] . Called [facility] in [place] [phone]. Tanisha requesting PT note be faxed to her at [phone] and she will review and see if patient can return.  Butch Penny aware of above . Patient has a Corporate investment banker but does not have a 3 in 1   1650 Tanisha with [facility] returned call they can accept patient back tomorrow with HHPT/OT with [facility].   Cory with [facility] aware.   Left daughter a message.   Called Tanisha back regarding oxygen need and provider , awaiting call back.  Expected Discharge Plan: Assisted Living     Patient Goals and CMS Choice Patient states their goals for this hospitalization and ongoing recovery are:: to return to home CMS Medicare.gov Compare Post Acute Care list provided to:: Patient Choice offered to / list presented to : Patient  Expected Discharge Plan and Services Expected Discharge Plan: Assisted Living   Discharge Planning Services: CM Consult   Living arrangements for the past 2 months: Apartment                 DME Arranged: 3-N-1                    Prior Living Arrangements/Services Living arrangements for the past 2 months: Apartment Lives with:: Self Patient language and need for interpreter reviewed:: Yes            Current home services: DME Criminal Activity/Legal Involvement Pertinent to Current Situation/Hospitalization: No - Comment as needed  Activities of Daily Living Home Assistive Devices/Equipment: Walker (specify type),Built-in shower seat ADL Screening (condition at time of admission) Patient's cognitive ability adequate to safely complete daily  activities?: Yes Is the patient deaf or have difficulty hearing?: No Does the patient have difficulty seeing, even when wearing glasses/contacts?: No Does the patient have difficulty concentrating, remembering, or making decisions?: No Patient able to express need for assistance with ADLs?: Yes Does the patient have difficulty dressing or bathing?: No Independently performs ADLs?: Yes (appropriate for developmental age) Does the patient have difficulty walking or climbing stairs?: Yes Weakness of Legs: None Weakness of Arms/Hands: None  Permission Sought/Granted   Permission granted to share information with : Yes, Verbal Permission Granted  Share Information with NAME: Ronnell Guadalajara [phone]           Emotional Assessment Appearance:: Appears stated age            Admission diagnosis:  Blood loss anemia [D50.0] Fall, initial encounter [W19.XXXA] Laceration of scalp, initial encounter [S01.01XA] Symptomatic anemia [D64.9] Gastrointestinal hemorrhage, unspecified gastrointestinal hemorrhage type [K92.2] Cerebrovascular accident (CVA), unspecified mechanism ([place]) [I63.9] Patient Active Problem List   Diagnosis Date Noted  . Symptomatic anemia 05/26/2020   PCP:  Pcp, No Pharmacy:  No Pharmacies Listed    Social Determinants of Health (SDOH) Interventions    Readmission Risk Interventions No flowsheet data found.

## 2020-05-29 NOTE — Evaluation (Signed)
Physical Therapy Evaluation Patient Details Name: MADELYNN MALSON MRN: [id number] DOB: 03-01-1940 Today's Date: 05/29/2020   History of Present Illness  Tersa is a 80 y/o female who presented to ED from ALF following a fall, where she sustained a head lac. Admitted on 4/8 and diagnosed with symptomatic anemia. CT concerning for small subacte infarct to R occipital lobe. PMH includes Alzheimer Disease, dementia, GERD, hypo-osmality, hypoatermia, and renal disease.    Clinical Impression  Pt received in bed, pleasantly confused. Max Ax1 for OOB mobility due to heavy posterior lean. Pt weight bearing through heels and pulling RW off ground. Pt unaware of posterior lean and unable to correct with cueing. Also needed cueing for sequencing and problem solving. Pt would benefit from PT to improve balance, decrease risk for falls, and increase independency. Left in chair with all needs met, call bell within reach, and chair alarm active. Will continue to follow acutely.    Follow Up Recommendations SNF;Supervision for mobility/OOB    Equipment Recommendations  Rolling walker with 5" wheels;3in1 (PT)    Recommendations for Other Services       Precautions / Restrictions Precautions Precautions: Fall Precaution Comments: watch O2 Restrictions Weight Bearing Restrictions: No      Mobility  Bed Mobility Overal bed mobility: Needs Assistance Bed Mobility: Supine to Sit     Supine to sit: Min assist;HOB elevated     General bed mobility comments: Assistance for sequencing and task initation, able to bring trunk upright on own after pt initiating moving legs towards EOB, use of bedrails, min A for scooting towards EOB    Transfers Overall transfer level: Needs assistance Equipment used: Rolling walker (2 wheeled) Transfers: Sit to/from [facility] Sit to Stand: Max assist Stand pivot transfers: Max assist       General transfer comment: Max Ax1 for steadying, heavy  posterior lean, cueing for handplacement, needed 2 attempts to clear hips  Ambulation/Gait             General Gait Details: deferred  Stairs            Wheelchair Mobility    Modified Rankin (Stroke Patients Only)       Balance Overall balance assessment: Needs assistance   Sitting balance-Leahy Scale: Fair     Standing balance support: During functional activity;Bilateral upper extremity supported Standing balance-Leahy Scale: Poor Standing balance comment: Reliant on B UE and external assistance                             Pertinent Vitals/Pain Pain Assessment: No/denies pain    Home Living Family/patient expects to be discharged to:: Assisted living               Home Equipment: None Additional Comments: Unsure if RW shes uses at [facility] if hers or belongs to the facility. No other equipment reported    Prior Function           Comments: Reports that the staff helps her with "all sorts of activities," states she does walking but just short distances with RW. Pt poor historian     Hand Dominance        Extremity/Trunk Assessment   Upper Extremity Assessment Upper Extremity Assessment: Defer to OT evaluation    Lower Extremity Assessment Lower Extremity Assessment: Generalized weakness    Cervical / Trunk Assessment Cervical / Trunk Assessment: Kyphotic  Communication   Communication: No difficulties  Cognition Arousal/Alertness: Awake/alert Behavior During Therapy: WFL for tasks assessed/performed Overall Cognitive Status: History of cognitive impairments - at baseline                                 General Comments: A&Ox4, answers questions appropriately, but seemed to have difficulty with memory. Very vague answers. Difficulty sequencing during mobility. Repeatedly asking throughout session "when can i go back to [place]?"      General Comments General comments (skin integrity, edema,  etc.): Pt reports not using O2 at baseline. Pt received with O2 connected to wall at 2L, but not wearing O2. Trialed RA, but pt droppign to 85 (with strong pleth) and 83 (with poor pleth) following supine to sit. Unable to recover, returned to 2L and rose to >92% after < 1 min. Left on 2L.    Exercises     Assessment/Plan    PT Assessment Patient needs continued PT services  PT Problem List         PT Treatment Interventions Therapeutic exercise;Gait training;Balance training;DME instruction;Neuromuscular re-education;Therapeutic activities;Patient/family education;Functional mobility training    PT Goals (Current goals can be found in the Care Plan section)  Acute Rehab PT Goals Patient Stated Goal: return to [facility] PT Goal Formulation: With patient Time For Goal Achievement: 06/12/20 Potential to Achieve Goals: Good    Frequency Min 2X/week   Barriers to discharge        Co-evaluation               AM-PAC PT "6 Clicks" Mobility  Outcome Measure Help needed turning from your back to your side while in a flat bed without using bedrails?: A Little Help needed moving from lying on your back to sitting on the side of a flat bed without using bedrails?: A Little Help needed moving to and from a bed to a chair (including a wheelchair)?: A Lot Help needed standing up from a chair using your arms (e.g., wheelchair or bedside chair)?: A Lot Help needed to walk in hospital room?: A Lot Help needed climbing 3-5 steps with a railing? : A Lot 6 Click Score: 14    End of Session Equipment Utilized During Treatment: Gait belt;Oxygen Activity Tolerance: Patient tolerated treatment well Patient left: in chair;with call bell/phone within reach;with chair alarm set Nurse Communication: Mobility status PT Visit Diagnosis: History of falling (Z91.81);Unsteadiness on feet (R26.81)    Time:  -      Charges:         Rosita Kea, SPT

## 2020-05-29 NOTE — Progress Notes (Signed)
Subjective:   Yesterday afternoon, patient underwent endoscopy which revealed an 8cm hiatal hernia, tortuous esophagus and multiple fundic gland polyps. Patient also underwent CTA head and neck and repeat MRI brain with and without contrast  No acute events overnight.  This morning, patient was sleeping comfortably. On awakening, she denies any acute complaints, including abdominal pain, throat pain, dizziness, headache.   Objective:  Vital signs in last 24 hours: Vitals:   05/28/20 1507 05/28/20 1530 05/28/20 2353 05/29/20 0418  BP: 138/61 (!) 146/86 138/64 139/69  Pulse: 77 83 82 72  Resp: 19 18 18 18   Temp:  98.6 F (37 C) 98.1 F (36.7 C) 98 F (36.7 C)  TempSrc:  Oral Oral Oral  SpO2: 95% 98%  97%  Weight:      Height:      On room air  Intake/Output Summary (Last 24 hours) at 05/29/2020 0950 Last data filed at 05/28/2020 2300 Gross per 24 hour  Intake 185.67 ml  Output 550 ml  Net -364.33 ml   Filed Weights   05/27/20 0443 05/28/20 1407  Weight: 69.1 kg 69.1 kg  Physical Exam Vitals and nursing note reviewed.  Constitutional:      General: She is not in acute distress.    Appearance: She is not ill-appearing.     Comments: Sleeping comfortably, awakens to loud verbal stimulation and participates in conversations appropriately  Cardiovascular:     Rate and Rhythm: Normal rate and regular rhythm.  Pulmonary:     Comments: Fine crackles in all lung fields bilaterally Abdominal:     General: Abdomen is flat.     Palpations: Abdomen is soft.     Tenderness: There is no abdominal tenderness.  Neurological:     General: No focal deficit present.     Mental Status: Mental status is at baseline.   Labs in last 24 hours: CBC Latest Ref Rng & Units 05/29/2020 05/28/2020 05/27/2020  WBC 4.0 - 10.5 K/uL 10.6(H) 11.7(H) 11.8(H)  Hemoglobin 12.0 - 15.0 g/dL 7.8(L) 7.3(L) 7.5(L)  Hematocrit 36.0 - 46.0 % 26.7(L) 25.1(L) 25.6(L)  Platelets 150 - 400 K/uL 203 206 191   MCV - 70.4 from 59.4 on admission  CMP Latest Ref Rng & Units 05/29/2020 05/28/2020 05/27/2020  Glucose 70 - 99 mg/dL 94 101(H) 103(H)  BUN 8 - 23 mg/dL 8 8 11   Creatinine 0.44 - 1.00 mg/dL 0.79 0.71 0.87  Sodium 135 - 145 mmol/L 137 134(L) 135  Potassium 3.5 - 5.1 mmol/L 3.5 3.8 3.3(L)  Chloride 98 - 111 mmol/L 104 104 103  CO2 22 - 32 mmol/L 26 22 24   Calcium 8.9 - 10.3 mg/dL 8.7(L) 8.6(L) 8.6(L)  Total Protein 6.5 - 8.1 g/dL - - 5.8(L)  Total Bilirubin 0.3 - 1.2 mg/dL - - 1.3(H)  Alkaline Phos 38 - 126 U/L - - 88  AST 15 - 41 U/L - - 18  ALT 0 - 44 U/L - - 15   COVID - negative  Imaging in last 24 hours: CT ANGIO HEAD W OR WO CONTRAST Result Date: 05/28/2020 IMPRESSION: 1. Persistent hypodensity right medial occipital lobe may represent a subacute infarct. No associated hemorrhage 2. Progression of subdural hygroma on the right likely related to recent head trauma. No acute intracranial hemorrhage. 3. No significant carotid or vertebral artery in the neck. 4. Mild intracranial atherosclerotic disease. Negative for significant stenosis or large vessel occlusion.  CT ANGIO NECK W OR WO CONTRAST Result Date: 05/28/2020 IMPRESSION: 1. Persistent  hypodensity right medial occipital lobe may represent a subacute infarct. No associated hemorrhage 2. Progression of subdural hygroma on the right likely related to recent head trauma. No acute intracranial hemorrhage. 3. No significant carotid or vertebral artery in the neck. 4. Mild intracranial atherosclerotic disease. Negative for significant stenosis or large vessel occlusion.  MR BRAIN W WO CONTRAST Result Date: 05/28/2020 IMPRESSION: Negative for acute infarct Compared with the MRI of 05/26/2020, interval development of subdural hematomas bilaterally as well as in the interhemispheric fissure. These appear to be CSF density on CT however there is evidence of hemorrhage on MRI. Findings compatible with recent head injury. 1.5 x 2.5 cm  hyperintensity in the right inferior occipital lobe best seen on FLAIR. Mild hyperintensity on T1. No abnormal enhancement. No restricted diffusion. No change from prior MRI. Possible subacute infarct with hemorrhage.  Assessment/Plan:  Principal Problem:   Symptomatic anemia  Alverna A. Panozzo is a 80 year old woman with past medical history significant for alzheimer disease, chronic severe iron deficiency anemia, and GERD in setting of hiatal hernia who presented to [facility] on 05/26/20 following a fall at home found to have severe acute on chronic microcytic anemia as well as a right occipital lobe subacute CVA.  #Acute on chronic iron deficiency anemia #8cm hiatal hernia Patient's hemoglobin continues to improve since admission (now 7.8 from 4.0). Patient's EGD does not reveal an active source of bleeding although it did reveal 8cm hiatal hernia which may be the source of chronic occult GI blood loss. Patient would benefit from continued close follow-up with gastroenterology and hematology in the outpatient setting with special attention to monitoring hemoglobin and iron with plan to transfuse blood and iron as needed. -Gastroenterology following; appreciate their recommendations  -No further surgical management or diagnostic evaluation  -Regular diet  -Continue pantoprazole 40mg  daily  -Discuss with neurology whether to start aspirin for subacute CVA  -Establish closer follow-up with hematologist to monitor hemoglobin and iron levels -CBC daily while admitted  #Right occipital subacute CVA, active #Bilateral subdural hematomas, active Patient continues to remain asymptomatic from her subacute right occipital subacute CVA. Patient's MRI with and without yesterday did reveal bilateral subdural hematomas without hygroma. Patient denies headache this morning and no new neurological deficits. -Neurology following, appreciate recommendations  -Discuss with neurology whether to start aspirin for  subacute CVA -PT/OT eval pending  #HLD, chronic Lipid panel reveals this condition to be appropriately controlled with LDL < 70 -Continue home atorvastatin 20mg  daily  #Alzheimer's disease, chronic -Continue home memantine and donepezil  #Anxiety, chronic -Continue home lorazepam 0.5mg  twice daily -Continue home trazodone 50mg  daily -Continue home buspirone 5mg  twice daily  #Pulmonary crackles Diffuse fine crackles on examination, with chest x-ray negative.  No signs of hypervolemia.  She has been using 2L supplemental oxygen while admitted, however I suspect this is more for comfort than for actual hypoxia, as there is no hypoxic events documented.  Differential includes ILD.  Will defer work-up until acute anemia has improved.  #VTE ppx: SCDs #IVF: None #Code status: Full code #Bowel regimen: Miralax daily  Prior to Admission Living Arrangement: [facility] in [place] Anticipated Discharge Location: [facility] in [place] Barriers to Discharge: Continued medical management Dispo: Anticipated discharge in approximately 0-1 day(s).   Dr. Paulla Dolly, MD Internal Medicine PGY-1 Pager: [phone] After 5pm on weekdays and 1pm on weekends: On Call pager [phone]  05/29/2020, 9:50 AM

## 2020-05-29 NOTE — Procedures (Signed)
   PROCEDURE: EEG STAT 22 minutes with video study   DATES OF TEST: 05/29/2020   REASON FOR TEST: acute encephalopathy   AED/Sedative MEDICATIONS: n/a   TECHNIQUE: This is an 18 channel digital EEG recording using the standard international 10/20 system of electrode placement with one channel EKG recording. Pt was awake and drowsy during this recording.   FINDINGS:  Background rhythm: symmetric diffuse 5-7 Hz, multiple myogenic artifact. Amplitude : normal Continuity: continuous Breach effect:  NO Variability: YES Reactivity: YES Rhythmic delta activity: NO Periodic discharges: NO Sporadic epileptiform discharges: NO Electrographic/electroclinical seizures: NO   Limited EKG reveals no abnormalities.      IMPRESSION:  This is an abnormal study due to - 1. Mild Generalized slowing is a non-specific finding consistent with a generalized disturbance of cerebral functioning including toxic, metabolic, or structural abnormalities that are multi-focal or diffuse. 2. No definite epileptiform discharges or electrographic seizures noted.

## 2020-05-29 NOTE — Progress Notes (Signed)
SATURATION QUALIFICATIONS: (This note is used to comply with regulatory documentation for home oxygen)  Patient Saturations on Room Air at Rest = 94-96%  Patient Saturations on Room Air while Ambulating = 84-86%  Patient Saturations on 2L Liters of oxygen while Ambulating = 92-94%  Please briefly explain why patient needs home oxygen: Patients O2 sat dropped when she began ambulating and required O2 to maintain a sat in the 90's.

## 2020-05-29 NOTE — Progress Notes (Addendum)
STROKE TEAM PROGRESS NOTE  , Sitting up in bed.  She has no complaints.  MRI scan of the brain with and without contrast was repeated yesterday and shows no acute infarct.  The right parieto-occipital subcortical T2/flair hyperintensity is unchanged and is of unclear etiology possibly subacute hemorrhagic infarct versus hemorrhagic contusion given patient propensity for increased falls..  There is interval increase in subdural hematomas bilaterally over the convexity with evidence of some blood in the left frontal and interhemispheric subdural which was not visible on the CT. CT angiogram of brain and neck does not reveal any significant large vessel stenosis or occlusion. Pa EEG is ordered and is pending tient is sittin patient is sitting up in bed g up comfortably in bed.  She has no complain patient is sitting up in bed ts.  MRI scan of the brain        Pertinent Lab Work and Imaging     CT Head WO IV Contrast low-density in the right medial temporal region possibly subacute infarct   CT Angio Head and Neck W WO IV Contrast no large vessel stenosis or occlusion   MRI Brain WO IV Contrast suboptimal with only limited images showing weak diffusion positive lesion more prominently seen on the FLAIR and T2 images from the right suboccipital parietal region of questionable significance   Echocardiogram Complete normal ejection fraction.  No cardiac source of embolism   Physical Examination   Constitutional: Pleasant elderly Caucasian lady not in distress.   Cardiovascular: Normal RR Respiratory: No increased WOB   Mental status: She is awake alert oriented to time and place.  Diminished attention, registration and recall.  Follows simple one-step commands. Speech: Clear without aphasia or apraxia. Cranial nerves: Pupils equal reactive.  Fundi not visualized.  Extraocular movements full range without nystagmus.  Blinks to threat bilaterally.  Face is symmetric without weakness. Motor:  Normal bulk and tone. No drift.                                        Sensory: Normal sensation bilaterally Coordination: Slow but accurate Reflexes: 1+ symmetric Gait: Not tested  NIHSS: 0  Assessment and Plan   [name] is a 80 y.o. female w/pmh of Alzheimer's who presents with recurrent falls and severe anemia being evaluated for the cause of bleeding.  CT scan and MRI show right suboccipital and medial temporal lesion of unclear etiology-silent subacute hemorrhagic infarct versus hemorrhagic contusion related to her falls..  She has no clinical signs and symptoms to go with a stroke in this location.  Recommend ; patient does not have a clear history of focal neurological symptoms compatible with a stroke and MRI findings are inconclusive hence I would not start aspirin given patient's bilateral subdural hematoma and propensity for increased falls.  Recommend check EEG.  Long discussion with patient and with Dr. Tobi Bastos and teaching service team and answered questions greater than 50% time during this 25-minute visit was spent on counseling and coordination of care and discussion with care team.   EEG shows mild generalized slowing.  Stroke team will sign off.  Kindly call for questions   Hospital day # [name], MD   To contact Stroke Continuity provider, please refer to [email or web address] After hours, contact General Neurology

## 2020-05-30 DIAGNOSIS — S065X9A Traumatic subdural hemorrhage with loss of consciousness of unspecified duration, initial encounter: Secondary | ICD-10-CM | POA: Diagnosis not present

## 2020-05-30 DIAGNOSIS — R0902 Hypoxemia: Secondary | ICD-10-CM

## 2020-05-30 DIAGNOSIS — J810 Acute pulmonary edema: Secondary | ICD-10-CM | POA: Diagnosis not present

## 2020-05-30 DIAGNOSIS — D649 Anemia, unspecified: Secondary | ICD-10-CM | POA: Diagnosis not present

## 2020-05-30 LAB — BASIC METABOLIC PANEL
Anion gap: 5 (ref 5–15)
BUN: 8 mg/dL (ref 8–23)
CO2: 26 mmol/L (ref 22–32)
Calcium: 8.5 mg/dL — ABNORMAL LOW (ref 8.9–10.3)
Chloride: 102 mmol/L (ref 98–111)
Creatinine, Ser: 0.8 mg/dL (ref 0.44–1.00)
GFR, Estimated: >60 mL/min (ref >60)
Glucose, Bld: 86 mg/dL (ref 70–99)
Potassium: 3.6 mmol/L (ref 3.5–5.1)
Sodium: 133 mmol/L — ABNORMAL LOW (ref 135–145)

## 2020-05-30 LAB — CBC
HCT: 27.3 % — ABNORMAL LOW (ref 36.0–46.0)
Hemoglobin: 7.8 g/dL — ABNORMAL LOW (ref 12.0–15.0)
MCH: 20.9 pg — ABNORMAL LOW (ref 26.0–34.0)
MCHC: 28.6 g/dL — ABNORMAL LOW (ref 30.0–36.0)
MCV: 73 fL — ABNORMAL LOW (ref 80.0–100.0)
Platelets: 203 10*3/uL (ref 150–400)
RBC: 3.74 MIL/uL — ABNORMAL LOW (ref 3.87–5.11)
RDW: 33.5 % — ABNORMAL HIGH (ref 11.5–15.5)
WBC: 10 10*3/uL (ref 4.0–10.5)
nRBC: 0.6 % — ABNORMAL HIGH (ref 0.0–0.2)

## 2020-05-30 LAB — BRAIN NATRIURETIC PEPTIDE: B Natriuretic Peptide: 138.1 pg/mL — ABNORMAL HIGH (ref 0.0–100.0)

## 2020-05-30 MED ORDER — FUROSEMIDE 10 MG/ML IJ SOLN
40.0000 mg | Freq: Once | INTRAMUSCULAR | Status: AC
  Administered 2020-05-30: 40 mg via INTRAVENOUS
  Filled 2020-05-30: qty 4

## 2020-05-30 NOTE — Evaluation (Signed)
Occupational Therapy Evaluation Patient Details Name: Leslie Rios MRN: [id number] DOB: 11/29/1940 Today's Date: 05/30/2020    History of Present Illness Leslie Rios is a 80 y/o female who presented to ED from ALF following a fall, where she sustained a head lac. Admitted on 4/8 and diagnosed with symptomatic anemia. CT concerning for small subacte infarct to R occipital lobe. PMH includes Alzheimer Disease, dementia, GERD, hypo-osmality, hypoatermia, and renal disease.   Clinical Impression   Pt admitted with the above diagnoses and presents with below problem list. Pt will benefit from continued acute OT to address the below listed deficits and maximize independence with basic ADLs prior to d/c top venue below. Pt is from ALF, she reports using a rollator. Unclear pt's PLOF and type/amount of assist with basic ADLs. Today, pt completed bed mobility with min A, stood and sidestepped along EOB with rw and mod A. Pt on 2L O2 via [place] throughout session.      Follow Up Recommendations  Supervision/Assistance - 24 hour;Other (comment) (back to ALF vs ST SNF?)    Equipment Recommendations  None recommended by OT    Recommendations for Other Services       Precautions / Restrictions Precautions Precautions: Fall Precaution Comments: watch O2 Restrictions Weight Bearing Restrictions: No      Mobility Bed Mobility Overal bed mobility: Needs Assistance Bed Mobility: Supine to Sit;Sit to Supine     Supine to sit: Min assist;HOB elevated Sit to supine: Min assist;Min guard   General bed mobility comments: Pt using therapist's arm to powerup trunk. Extra time and effort. Close min guard to return to supine.    Transfers Overall transfer level: Needs assistance Equipment used: Rolling walker (2 wheeled) Transfers: Sit to/from Stand Sit to Stand: Mod assist Stand pivot transfers: Mod assist       General transfer comment: Assist to stabilize rw and pt uses BUE to pull up on rw  (rollator at baseline?). Assist to powerup and steady. Took sidesteps along EOB about 2' with mod A and sequencing cues. Fatigued suddenly back in seated position.    Balance Overall balance assessment: Needs assistance   Sitting balance-Leahy Scale: Fair     Standing balance support: During functional activity;Bilateral upper extremity supported Standing balance-Leahy Scale: Poor Standing balance comment: Reliant on B UE and external assistance                           ADL either performed or assessed with clinical judgement   ADL Overall ADL's : Needs assistance/impaired Eating/Feeding: Set up;Sitting Eating/Feeding Details (indicate cue type and reason): per clinical judgment, pt declined food and drink this session "I'm not [place]" Grooming: Moderate assistance;Sitting   Upper Body Bathing: Moderate assistance;Sitting   Lower Body Bathing: Moderate assistance;Sit to/from stand   Upper Body Dressing : Moderate assistance;Sitting   Lower Body Dressing: Moderate assistance;Sit to/from stand   Toilet Transfer: Moderate assistance;Stand-pivot;BSC;RW Toilet Transfer Details (indicate cue type and reason): sidestepped about 2' along EOB Toileting- Clothing Manipulation and Hygiene: Maximal assistance;Sit to/from stand         General ADL Comments: Pt with fair sitting balance. Mod A to stand, up to mod A to sidestep, sequencing cues needed.     Vision Baseline Vision/History: Wears glasses       Perception     Praxis      Pertinent Vitals/Pain Pain Assessment: Faces Faces Pain Scale: Hurts a little bit Pain Location: unspecified. mild grimacing  with bed mobility and in standing Pain Descriptors / Indicators: Grimacing Pain Intervention(s): Monitored during session     Hand Dominance     Extremity/Trunk Assessment Upper Extremity Assessment Upper Extremity Assessment: Generalized weakness   Lower Extremity Assessment Lower Extremity Assessment:  Defer to PT evaluation   Cervical / Trunk Assessment Cervical / Trunk Assessment: Kyphotic   Communication Communication Communication: No difficulties   Cognition Arousal/Alertness: Awake/alert Behavior During Therapy: Flat affect;WFL for tasks assessed/performed Overall Cognitive Status: History of cognitive impairments - at baseline                                     General Comments  On 2L O2 via [place]. Repositioned back in to place at start of session as Jerome noted to be out of nostrils.    Exercises     Shoulder Instructions      Home Living Family/patient expects to be discharged to:: Assisted living                                 Additional Comments: Unsure if RW shes uses at [facility] if hers or belongs to the facility. No other equipment reported      Prior Functioning/Environment Level of Independence: Needs assistance  Gait / Transfers Assistance Needed: pt reports she uses a rollator ADL's / Homemaking Assistance Needed: unsure PLOF with basic ADLs, pt unable to provide reliable history            OT Problem List: Decreased strength;Decreased activity tolerance;Impaired balance (sitting and/or standing);Decreased cognition;Decreased knowledge of use of DME or AE;Decreased knowledge of precautions;Cardiopulmonary status limiting activity;Pain      OT Treatment/Interventions: Self-care/ADL training;Energy conservation;Therapeutic exercise;Therapeutic activities;Balance training;Patient/family education    OT Goals(Current goals can be found in the care plan section) Acute Rehab OT Goals Patient Stated Goal: return to [facility] Goal Formulation: With patient Time For Goal Achievement: 06/13/20 Potential to Achieve Goals: Good ADL Goals Pt Will Perform Upper Body Dressing: with min assist;sitting Pt Will Perform Lower Body Dressing: with min assist;sit to/from stand Pt Will Transfer to Toilet: with min  assist;ambulating;stand pivot transfer;bedside commode Pt Will Perform Toileting - Clothing Manipulation and hygiene: with min assist;sitting/lateral leans;sit to/from stand Additional ADL Goal #1: Pt will complete bed mobility at mod I level to prepare for EOB/OOB ADLs.  OT Frequency: Min 2X/week   Barriers to D/C:            Co-evaluation              AM-PAC OT "6 Clicks" Daily Activity     Outcome Measure Help from another person eating meals?: None Help from another person taking care of personal grooming?: A Little Help from another person toileting, which includes using toliet, bedpan, or urinal?: A Lot Help from another person bathing (including washing, rinsing, drying)?: A Lot Help from another person to put on and taking off regular upper body clothing?: A Little Help from another person to put on and taking off regular lower body clothing?: A Lot 6 Click Score: 16   End of Session Equipment Utilized During Treatment: Rolling walker;Oxygen (2L)  Activity Tolerance: Patient limited by fatigue;Patient tolerated treatment well Patient left: in bed;with call bell/phone within reach;with bed alarm set;with SCD's reapplied  OT Visit Diagnosis: Unsteadiness on feet (R26.81);Muscle weakness (generalized) (M62.81);Other symptoms and signs  involving cognitive function;Pain                Time: 1206-1215 OT Time Calculation (min): 9 min Charges:  OT General Charges $OT Visit: 1 Visit OT Evaluation $OT Eval Low Complexity: [place], OT Acute Rehabilitation Services Pager: [phone] Office: [phone]   Hortencia Pilar 05/30/2020, 12:34 PM

## 2020-05-30 NOTE — Progress Notes (Signed)
Subjective:   Yesterday afternoon, patient underwent EEG.  Overnight, patient received High resolution CT Chest  This morning, patient denies any acute complaints, including cough, shortness of breath, congestion or abdominal pain.   Objective:  Vital signs in last 24 hours: Vitals:   05/29/20 1100 05/29/20 1651 05/29/20 2326 05/30/20 0445  BP: (!) 144/75 (!) 152/72 127/61 (!) 154/67  Pulse: 83 82 75 75  Resp: 18 18 19 17   Temp: 97.8 F (36.6 C) 97.8 F (36.6 C) 98.4 F (36.9 C) 98 F (36.7 C)  TempSrc: Oral Oral Oral Oral  SpO2: 96% 93% 95% 95%  Weight:      Height:      On 2L oxygen via nasal cannula No intake or output data in the 24 hours ending 05/30/20 1030 Filed Weights   05/27/20 0443 05/28/20 1407  Weight: 69.1 kg 69.1 kg  Physical Exam Vitals and nursing note reviewed.  Constitutional:      General: She is not in acute distress.    Appearance: She is not ill-appearing.  Cardiovascular:     Rate and Rhythm: Normal rate and regular rhythm.     Pulses: Normal pulses.     Heart sounds: Normal heart sounds.  Pulmonary:     Effort: Pulmonary effort is normal. No respiratory distress.     Comments: Fine crackles in bilateral mid to lower lung fields bilaterally. Absent breath sounds in bilateral lower lung fields. Abdominal:     General: Abdomen is flat.     Palpations: Abdomen is soft.     Tenderness: There is no abdominal tenderness.  Musculoskeletal:     Right lower leg: No edema.     Left lower leg: No edema.  Neurological:     General: No focal deficit present.     Mental Status: Mental status is at baseline.   Labs in last 24 hours: CBC Latest Ref Rng & Units 05/30/2020 05/29/2020 05/28/2020  WBC 4.0 - 10.5 K/uL 10.0 10.6(H) 11.7(H)  Hemoglobin 12.0 - 15.0 g/dL 7.8(L) 7.8(L) 7.3(L)  Hematocrit 36.0 - 46.0 % 27.3(L) 26.7(L) 25.1(L)  Platelets 150 - 400 K/uL 203 203 206  MCV - 73.0 from 59.4 on admission  CMP Latest Ref Rng & Units 05/30/2020  05/29/2020 05/28/2020  Glucose 70 - 99 mg/dL 86 94 101(H)  BUN 8 - 23 mg/dL 8 8 8   Creatinine 0.44 - 1.00 mg/dL 0.80 0.79 0.71  Sodium 135 - 145 mmol/L 133(L) 137 134(L)  Potassium 3.5 - 5.1 mmol/L 3.6 3.5 3.8  Chloride 98 - 111 mmol/L 102 104 104  CO2 22 - 32 mmol/L 26 26 22   Calcium 8.9 - 10.3 mg/dL 8.5(L) 8.7(L) 8.6(L)  Total Protein 6.5 - 8.1 g/dL - - -  Total Bilirubin 0.3 - 1.2 mg/dL - - -  Alkaline Phos 38 - 126 U/L - - -  AST 15 - 41 U/L - - -  ALT 0 - 44 U/L - - -   Imaging in last 24 hours: CT Chest High Resolution Result Date: 05/29/2020 IMPRESSION: 1. Small to moderate bilateral pleural effusions with associated atelectasis or consolidation. 2. Examination of the lung parenchyma is significantly limited by breath motion artifact throughout and the presence of pleural effusions. Within this limitation, there is irregular ground-glass airspace opacity and septal thickening throughout the lungs, likely reflecting atypical infection and edema. Underlying fibrotic interstitial lung disease is not excluded on the basis of this examination. Consider follow-up CT at the resolution of acute clinical presentation  and pleural effusions to assess for interstitial lung disease if there is high persistent clinical concern. 3. Large hiatal hernia with intrathoracic position of the gastric body and fundus. 4. Coronary artery disease. Aortic Atherosclerosis (ICD10-I70.0).  EEG adult Result Date: 05/29/2020  IMPRESSION: This is an abnormal study due to - 1. Mild Generalized slowing is a non-specific finding consistent with a generalized disturbance of cerebral functioning including toxic, metabolic, or structural abnormalities that are multi-focal or diffuse. 2. No definite epileptiform discharges or electrographic seizures noted.    Assessment/Plan:  Principal Problem:   Symptomatic anemia Active Problems:   Subdural hematoma (HCC)  Leslie Rios is a 80 year old woman with past medical  history significant for alzheimer disease, chronic severe iron deficiency anemia, and GERD in setting of hiatal hernia who presented to [facility] following a fall at home found to have severe acute on chronic microcytic anemia as well as a right occipital lobe subacute CVA with hospital course complicated by hypoxia requiring supplemental oxygen.  #Hypoxia with bilateral small pleural effusions and irregular GGOs, new Patient underwent high resolution CT yesterday which showed findings concerning for edema, small/moderate bilateral pleural effusions, septal thickening and irregular ground glass opacities. Differential for cause of new onset hypoxia with ambulation includes volume overload (although euvolemic on examination without evidence of volume overload on recent echocardiogram), atypical infection (although patient is asymptomatic without cough or upper respiratory symptoms), or chronic pulmonary disease such as interstitial lung disease. -Obtain BNP now  -If elevated >100 then IV diuresis and monitor overnight  -If less than 100 then resume home diuretic regimen and consider starting azithromycin for atypical infection -Regardless, patient will need close follow-up outpatient for this problem  #Iron deficiency anemia, stable #8cm hiatal hernia, chronic Patient's hemoglobin continues to remain stable. Patient would benefit from continued close follow-up with gastroenterology and hematology in the outpatient setting with special attention to monitoring hemoglobin and iron with plan to transfuse blood and iron as needed -No further surgical management or diagnostic evaluation -Regular diet -Continue pantoprazole 40mg  daily -Establish closer follow-up with hematologist to monitor hemoglobin and iron levels -CBC daily while admitted  #Right occipital contusion with bilateral subdural hematomas, stable Patient continues to remain asymptomatic from her right occipital contusion and  bilateral subdural hematomas with no new neurological deficits. -Follow-up with PCP, determine need for neurology follow-up  #HLD, chronic Lipid panel reveals this condition to be appropriately controlled with LDL < 70 -Continue home atorvastatin 20mg  daily  #Alzheimer's disease, chronic -Continue home memantine and donepezil  #Anxiety, chronic -Continue home lorazepam 0.5mg  twice daily -Continue home trazodone 50mg  daily -Continue home buspirone 5mg  twice daily  #VTE ppx: SCDs #IVF: None #Code status: Full code #Bowel regimen: Miralax daily  Prior to Admission Living Arrangement: [facility] in [place] Anticipated Discharge Location: [facility] in [place] Barriers to Discharge: Continued medical management Dispo: Anticipated discharge in approximately 0-1 day(s).   Dr. Paulla Dolly, MD Internal Medicine PGY-1 Pager: [phone] After 5pm on weekdays and 1pm on weekends: On Call pager [phone]  05/30/2020, 10:30 AM

## 2020-05-30 NOTE — Progress Notes (Signed)
RN offered to assist pt up to chair, pt declined. PT repositioned in bed by RN and NT. She denied any pain or discomfort. Will continue to monitor.

## 2020-05-30 NOTE — TOC Progression Note (Addendum)
Transition of Care [facility]) - Progression Note    Patient Details  Name: Leslie Rios MRN: [id number] Date of Birth: 09-30-1940  [facility]) CM/SW Contact  Jacalyn Lefevre Edson Snowball, RN Phone Number: 05/30/2020, 12:25 PM  Clinical Narrative:     Anticipated discharge date tomorrow 05/31/20 called ALF and left message for Tanisha. MD will call daughter with update  Yvetta Coder called back and is aware. IF patient needs oxygen they use [facility], in [place]  Expected Discharge Plan: Assisted Living    Expected Discharge Plan and Services Expected Discharge Plan: Assisted Living   Discharge Planning Services: CM Consult   Living arrangements for the past 2 months: Apartment Expected Discharge Date: 05/29/20               DME Arranged: 3-N-1                     Social Determinants of Health (SDOH) Interventions    Readmission Risk Interventions No flowsheet data found.

## 2020-05-31 DIAGNOSIS — S065X9A Traumatic subdural hemorrhage with loss of consciousness of unspecified duration, initial encounter: Secondary | ICD-10-CM | POA: Diagnosis not present

## 2020-05-31 DIAGNOSIS — R0902 Hypoxemia: Secondary | ICD-10-CM | POA: Diagnosis not present

## 2020-05-31 DIAGNOSIS — D649 Anemia, unspecified: Secondary | ICD-10-CM | POA: Diagnosis not present

## 2020-05-31 DIAGNOSIS — J810 Acute pulmonary edema: Secondary | ICD-10-CM | POA: Diagnosis not present

## 2020-05-31 LAB — CBC
HCT: 31.3 % — ABNORMAL LOW (ref 36.0–46.0)
Hemoglobin: 9 g/dL — ABNORMAL LOW (ref 12.0–15.0)
MCH: 21.4 pg — ABNORMAL LOW (ref 26.0–34.0)
MCHC: 28.8 g/dL — ABNORMAL LOW (ref 30.0–36.0)
MCV: 74.3 fL — ABNORMAL LOW (ref 80.0–100.0)
Platelets: 229 10*3/uL (ref 150–400)
RBC: 4.21 MIL/uL (ref 3.87–5.11)
WBC: 9.5 10*3/uL (ref 4.0–10.5)
nRBC: 0.2 % (ref 0.0–0.2)

## 2020-05-31 LAB — SARS CORONAVIRUS 2 (TAT 6-24 HRS): SARS Coronavirus 2: NEGATIVE

## 2020-05-31 LAB — BASIC METABOLIC PANEL
Anion gap: 8 (ref 5–15)
BUN: 10 mg/dL (ref 8–23)
CO2: 28 mmol/L (ref 22–32)
Calcium: 9 mg/dL (ref 8.9–10.3)
Chloride: 100 mmol/L (ref 98–111)
Creatinine, Ser: 0.88 mg/dL (ref 0.44–1.00)
GFR, Estimated: >60 mL/min (ref >60)
Glucose, Bld: 83 mg/dL (ref 70–99)
Potassium: 3.5 mmol/L (ref 3.5–5.1)
Sodium: 136 mmol/L (ref 135–145)

## 2020-05-31 MED ORDER — FUROSEMIDE 20 MG PO TABS: ORAL_TABLET | ORAL | 0 refills | Status: AC

## 2020-05-31 NOTE — NC FL2 (Addendum)
[place] LEVEL OF CARE SCREENING TOOL     IDENTIFICATION  Patient Name: Leslie Rios Birthdate: 02-09-1941 Sex: female Admission Date (Current Location): 05/26/2020  [facility] and [place] Number:  Herbalist and Address:  [facility] [place], [place] [address], [place], [place] [address]      Provider Number: [id number]  Attending Physician Name and Address:  Velna Ochs, MD  Relative Name and Phone Number:  Ronnell [place] [phone]    Current Level of Care: Hospital Recommended Level of Care: [place] Prior Approval Number:    Date Approved/Denied:   PASRR Number:    Discharge Plan: Other (Comment) (Assited Living)    Current Diagnoses: Patient Active Problem List   Diagnosis Date Noted  . Hypoxia   . Acute pulmonary edema (HCC)   . Subdural hematoma ([place])   . Symptomatic anemia 05/26/2020    Orientation RESPIRATION BLADDER Height & Weight     Self,Time,Situation,Place  Normal Continent Weight: 69.1 kg Height:  5' (152.4 cm)  BEHAVIORAL SYMPTOMS/MOOD NEUROLOGICAL BOWEL NUTRITION STATUS      Continent Diet  AMBULATORY STATUS COMMUNICATION OF NEEDS Skin   Extensive Assist Verbally Normal                       Personal Care Assistance Level of Assistance  Bathing,Feeding,Dressing Bathing Assistance: Limited assistance Feeding assistance: Limited assistance Dressing Assistance: Limited assistance     Functional Limitations Info  Sight Sight Info: Adequate        SPECIAL CARE FACTORS FREQUENCY                       Contractures Contractures Info: Not present    Additional Factors Info  Code Status Code Status Info: Full             Current Medications (05/31/2020):  This is the current hospital active medication list Current Facility-Administered Medications  Medication Dose Route Frequency Provider Last Rate Last Admin  . 0.9 %  sodium chloride infusion  10 mL/hr Intravenous  Once Nelida Meuse III, MD      . acetaminophen (TYLENOL) tablet 650 mg  650 mg Oral Q6H PRN Nelida Meuse III, MD       Or  . acetaminophen (TYLENOL) suppository 650 mg  650 mg Rectal Q6H PRN Nelida Meuse III, MD      . atorvastatin (LIPITOR) tablet 20 mg  20 mg Oral QHS Nelida Meuse III, MD   20 mg at 05/30/20 2201  . busPIRone (BUSPAR) tablet 5 mg  5 mg Oral BID Nelida Meuse III, MD   5 mg at 05/31/20 0826  . donepezil (ARICEPT) tablet 10 mg  10 mg Oral QHS Nelida Meuse III, MD   10 mg at 05/30/20 2202  . LORazepam (ATIVAN) tablet 0.5 mg  0.5 mg Oral BID Nelida Meuse III, MD   0.5 mg at 05/31/20 0826  . memantine (NAMENDA XR) 24 hr capsule 28 mg  28 mg Oral QHS Nelida Meuse III, MD   28 mg at 05/30/20 2202  . mirabegron ER (MYRBETRIQ) tablet 25 mg  25 mg Oral Daily Nelida Meuse III, MD   25 mg at 05/31/20 0826  . pantoprazole (PROTONIX) EC tablet 40 mg  40 mg Oral Daily Nelida Meuse III, MD   40 mg at 05/31/20 0826  . polyethylene glycol (MIRALAX /  GLYCOLAX) packet 17 g  17 g Oral Daily Nelida Meuse III, MD   17 g at 05/31/20 0826  . sodium chloride flush (NS) 0.9 % injection 3 mL  3 mL Intravenous Q12H Nelida Meuse III, MD   3 mL at 05/31/20 0827  . traZODone (DESYREL) tablet 50 mg  50 mg Oral QHS Nelida Meuse III, MD   50 mg at 05/30/20 2202  . venlafaxine XR (EFFEXOR-XR) 24 hr capsule 150 mg  150 mg Oral Q breakfast Nelida Meuse III, MD   150 mg at 05/31/20 2876     Discharge Medications: atorvastatin 20 MG tablet Commonly known as: LIPITOR Take 10 mg by mouth at bedtime.   busPIRone 5 MG tablet Commonly known as: BUSPAR Take 5 mg by mouth 2 (two) times daily.   clotrimazole-betamethasone cream Commonly known as: LOTRISONE Apply 1 application topically 2 (two) times daily. Apply to yeast rash at groin.   furosemide 20 MG tablet Commonly known as: LASIX Take 2 tablets (40 mg total) by mouth daily for 2 days, THEN 1 tablet (20 mg total) daily for 28  days. Start taking on: May 31, 2020 What changed: See the new instructions.   LORazepam 0.5 MG tablet Commonly known as: ATIVAN Take 0.5 mg by mouth 2 (two) times daily as needed for anxiety.   Myrbetriq 25 MG Tb24 tablet Generic drug: mirabegron ER Take 25 mg by mouth daily.   Namzaric 28-10 MG Cp24 Generic drug: Memantine HCl-Donepezil HCl Take 1 capsule by mouth at bedtime.   ondansetron 4 MG tablet Commonly known as: ZOFRAN Take 4 mg by mouth as needed for nausea/vomiting.   pantoprazole 40 MG tablet Commonly known as: PROTONIX Take 40 mg by mouth daily.   traZODone 50 MG tablet Commonly known as: DESYREL Take 50 mg by mouth at bedtime.   venlafaxine XR 150 MG 24 hr capsule Commonly known as: EFFEXOR-XR Take 150 mg by mouth daily.        Relevant Imaging Results:  Relevant Lab Results:   Additional Information oriented to person and place, room air  Jennilee Demarco, Edson Snowball, RN

## 2020-05-31 NOTE — Progress Notes (Signed)
SATURATION QUALIFICATIONS: (This note is used to comply with regulatory documentation for home oxygen)  Patient Saturations on Room Air at Rest = 93%  Patient Saturations on Room Air while Ambulating = 88-90%  Patient Saturations on 0 Liters of oxygen while Ambulating = NA%  Please briefly explain why patient needs home oxygen:

## 2020-05-31 NOTE — Care Management Important Message (Signed)
Important Message  Patient Details  Name: [name] MRN: [id number] Date of Birth: 04-Jan-1941   Medicare Important Message Given:  Yes     Darren Nodal 05/31/2020, 8:25 AM

## 2020-05-31 NOTE — TOC Progression Note (Addendum)
Transition of Care [facility]) - Progression Note    Patient Details  Name: Leslie Rios MRN: [id number] Date of Birth: 1940/11/08  Transition of Care [facility]) CM/SW Contact  Jacalyn Lefevre Edson Snowball, RN Phone Number: 05/31/2020, 11:29 AM  Clinical Narrative:     Patient ready for discharge today, on room air.   FL2 done asked for signature. Once signed will fax FL2 and discharge summary to [place] at [facility]. Left message awaiting call back   Home health arranged with Tommi Rumps with [facility] , he is aware discharge today   [facility] at [facility] returned call. NCM faxed FL2 and DC summary . Secure chatted nurse to call report to [phone] . Will see what time nurse wants PTAR called.   Daughter Butch Penny aware discharge is today and requested PTAR transport. PTAR called .  Expected Discharge Plan: Assisted Living     Expected Discharge Plan and Services Expected Discharge Plan: Assisted Living   Discharge Planning Services: CM Consult   Living arrangements for the past 2 months: Apartment Expected Discharge Date: 05/31/20               DME Arranged: 3-N-1                     Social Determinants of Health (SDOH) Interventions    Readmission Risk Interventions No flowsheet data found.

## 2020-05-31 NOTE — Progress Notes (Signed)
RN called [facility] and gave report to [place] who stated understanding. IV has been removed and pt has been dressed waiting for [facility]

## 2020-06-01 DIAGNOSIS — J90 Pleural effusion, not elsewhere classified: Secondary | ICD-10-CM | POA: Diagnosis not present

## 2020-06-01 DIAGNOSIS — F0390 Unspecified dementia without behavioral disturbance: Secondary | ICD-10-CM | POA: Diagnosis not present

## 2020-06-01 DIAGNOSIS — M6281 Muscle weakness (generalized): Secondary | ICD-10-CM | POA: Diagnosis not present

## 2020-06-01 DIAGNOSIS — D509 Iron deficiency anemia, unspecified: Secondary | ICD-10-CM | POA: Diagnosis not present

## 2020-06-01 DIAGNOSIS — R296 Repeated falls: Secondary | ICD-10-CM | POA: Diagnosis not present

## 2020-06-01 DIAGNOSIS — S065X9A Traumatic subdural hemorrhage with loss of consciousness of unspecified duration, initial encounter: Secondary | ICD-10-CM | POA: Diagnosis not present

## 2020-06-02 ENCOUNTER — Other Ambulatory Visit: Payer: Self-pay

## 2020-06-02 DIAGNOSIS — F0280 Dementia in other diseases classified elsewhere without behavioral disturbance: Secondary | ICD-10-CM | POA: Diagnosis not present

## 2020-06-02 DIAGNOSIS — N189 Chronic kidney disease, unspecified: Secondary | ICD-10-CM | POA: Diagnosis not present

## 2020-06-02 DIAGNOSIS — Z9181 History of falling: Secondary | ICD-10-CM | POA: Diagnosis not present

## 2020-06-02 DIAGNOSIS — R1310 Dysphagia, unspecified: Secondary | ICD-10-CM | POA: Diagnosis not present

## 2020-06-02 DIAGNOSIS — G309 Alzheimer's disease, unspecified: Secondary | ICD-10-CM | POA: Diagnosis not present

## 2020-06-02 DIAGNOSIS — K219 Gastro-esophageal reflux disease without esophagitis: Secondary | ICD-10-CM | POA: Diagnosis not present

## 2020-06-02 DIAGNOSIS — Z87440 Personal history of urinary (tract) infections: Secondary | ICD-10-CM | POA: Diagnosis not present

## 2020-06-02 NOTE — Patient Outreach (Signed)
[facility]) Care Management  06/02/2020  Leslie Rios 1940/08/18 [id number]     Transition of Care Referral  Referral Date: 06/02/2020 Referral Source: [facility] Discharge Report Date of Discharge: 05/31/2020 Facility: [facility]    Referral received. Upon chart review noted patient discharged to facility.    Plan: RN CM will close referral at this time.    Enzo Montgomery, RN,BSN,CCM [facility] Telephonic Care Management Coordinator Direct Phone: [phone] Toll Free: [phone] Fax: [phone]

## 2020-06-06 ENCOUNTER — Emergency Department (HOSPITAL_COMMUNITY): Payer: Medicare HMO

## 2020-06-06 ENCOUNTER — Encounter (HOSPITAL_COMMUNITY): Payer: Self-pay

## 2020-06-06 ENCOUNTER — Other Ambulatory Visit: Payer: Self-pay

## 2020-06-06 ENCOUNTER — Emergency Department (HOSPITAL_COMMUNITY)
Admission: EM | Admit: 2020-06-06 | Discharge: 2020-06-07 | Disposition: A | Discharge status: Home / Self Care | Payer: Medicare HMO | Attending: Emergency Medicine | Admitting: Emergency Medicine

## 2020-06-06 DIAGNOSIS — S51012A Laceration without foreign body of left elbow, initial encounter: Secondary | ICD-10-CM | POA: Insufficient documentation

## 2020-06-06 DIAGNOSIS — G309 Alzheimer's disease, unspecified: Secondary | ICD-10-CM | POA: Insufficient documentation

## 2020-06-06 DIAGNOSIS — M50322 Other cervical disc degeneration at C5-C6 level: Secondary | ICD-10-CM | POA: Diagnosis not present

## 2020-06-06 DIAGNOSIS — Y92129 Unspecified place in nursing home as the place of occurrence of the external cause: Secondary | ICD-10-CM | POA: Diagnosis not present

## 2020-06-06 DIAGNOSIS — S59902A Unspecified injury of left elbow, initial encounter: Secondary | ICD-10-CM | POA: Diagnosis present

## 2020-06-06 DIAGNOSIS — W19XXXA Unspecified fall, initial encounter: Secondary | ICD-10-CM | POA: Insufficient documentation

## 2020-06-06 DIAGNOSIS — M50321 Other cervical disc degeneration at C4-C5 level: Secondary | ICD-10-CM | POA: Diagnosis not present

## 2020-06-06 DIAGNOSIS — F0280 Dementia in other diseases classified elsewhere without behavioral disturbance: Secondary | ICD-10-CM | POA: Insufficient documentation

## 2020-06-06 DIAGNOSIS — N390 Urinary tract infection, site not specified: Secondary | ICD-10-CM | POA: Diagnosis not present

## 2020-06-06 DIAGNOSIS — R918 Other nonspecific abnormal finding of lung field: Secondary | ICD-10-CM | POA: Diagnosis not present

## 2020-06-06 DIAGNOSIS — D181 Lymphangioma, any site: Secondary | ICD-10-CM | POA: Diagnosis not present

## 2020-06-06 DIAGNOSIS — M25522 Pain in left elbow: Secondary | ICD-10-CM | POA: Diagnosis not present

## 2020-06-06 DIAGNOSIS — R296 Repeated falls: Secondary | ICD-10-CM | POA: Diagnosis not present

## 2020-06-06 DIAGNOSIS — S72045A Nondisplaced fracture of base of neck of left femur, initial encounter for closed fracture: Secondary | ICD-10-CM | POA: Diagnosis not present

## 2020-06-06 DIAGNOSIS — G319 Degenerative disease of nervous system, unspecified: Secondary | ICD-10-CM | POA: Diagnosis not present

## 2020-06-06 DIAGNOSIS — Z043 Encounter for examination and observation following other accident: Secondary | ICD-10-CM | POA: Diagnosis not present

## 2020-06-06 DIAGNOSIS — R0902 Hypoxemia: Secondary | ICD-10-CM | POA: Diagnosis not present

## 2020-06-06 DIAGNOSIS — S299XXA Unspecified injury of thorax, initial encounter: Secondary | ICD-10-CM | POA: Diagnosis not present

## 2020-06-06 DIAGNOSIS — S0990XA Unspecified injury of head, initial encounter: Secondary | ICD-10-CM | POA: Diagnosis not present

## 2020-06-06 LAB — CBC WITH DIFFERENTIAL/PLATELET
Abs Immature Granulocytes: 0.04 10*3/uL (ref 0.00–0.07)
Basophils Absolute: 0.1 10*3/uL (ref 0.0–0.1)
Basophils Relative: 2 %
Eosinophils Absolute: 0.2 10*3/uL (ref 0.0–0.5)
Eosinophils Relative: 3 %
HCT: 37.9 % (ref 36.0–46.0)
Hemoglobin: 11.1 g/dL — ABNORMAL LOW (ref 12.0–15.0)
Immature Granulocytes: 1 %
Lymphocytes Relative: 21 %
Lymphs Abs: 1.4 10*3/uL (ref 0.7–4.0)
MCH: 23.3 pg — ABNORMAL LOW (ref 26.0–34.0)
MCHC: 29.3 g/dL — ABNORMAL LOW (ref 30.0–36.0)
MCV: 79.5 fL — ABNORMAL LOW (ref 80.0–100.0)
Monocytes Absolute: 0.7 10*3/uL (ref 0.1–1.0)
Monocytes Relative: 10 %
Neutro Abs: 4.3 10*3/uL (ref 1.7–7.7)
Neutrophils Relative %: 63 %
Platelets: 220 10*3/uL (ref 150–400)
RBC: 4.77 MIL/uL (ref 3.87–5.11)
WBC: 6.6 10*3/uL (ref 4.0–10.5)
nRBC: 0 % (ref 0.0–0.2)

## 2020-06-06 LAB — TYPE AND SCREEN
ABO/RH(D): O POS
Antibody Screen: NEGATIVE

## 2020-06-06 LAB — COMPREHENSIVE METABOLIC PANEL
ALT: 19 U/L (ref 0–44)
AST: 22 U/L (ref 15–41)
Albumin: 3.2 g/dL — ABNORMAL LOW (ref 3.5–5.0)
Alkaline Phosphatase: 134 U/L — ABNORMAL HIGH (ref 38–126)
Anion gap: 7 (ref 5–15)
BUN: 13 mg/dL (ref 8–23)
CO2: 31 mmol/L (ref 22–32)
Calcium: 9.3 mg/dL (ref 8.9–10.3)
Chloride: 101 mmol/L (ref 98–111)
Creatinine, Ser: 0.93 mg/dL (ref 0.44–1.00)
GFR, Estimated: >60 mL/min (ref >60)
Glucose, Bld: 93 mg/dL (ref 70–99)
Potassium: 3.7 mmol/L (ref 3.5–5.1)
Sodium: 139 mmol/L (ref 135–145)
Total Bilirubin: 0.5 mg/dL (ref 0.3–1.2)
Total Protein: 6.5 g/dL (ref 6.5–8.1)

## 2020-06-06 LAB — URINALYSIS, ROUTINE W REFLEX MICROSCOPIC
Bilirubin Urine: NEGATIVE
Glucose, UA: NEGATIVE mg/dL
Hgb urine dipstick: NEGATIVE
Ketones, ur: NEGATIVE mg/dL
Nitrite: POSITIVE — AB
Protein, ur: NEGATIVE mg/dL
Specific Gravity, Urine: 1.015 (ref 1.005–1.030)
WBC, UA: >50 WBC/hpf — ABNORMAL HIGH (ref 0–5)
pH: 5 (ref 5.0–8.0)

## 2020-06-06 IMAGING — CR DG ELBOW COMPLETE 3+V*L*
2 series · 2 of 2 positions shown · non-contrast
Comparison: None.

CLINICAL DATA: Multiple falls.  Left elbow pain.

EXAM:
LEFT ELBOW - COMPLETE 3+ VIEW

[elbow obl (1 of 2)]
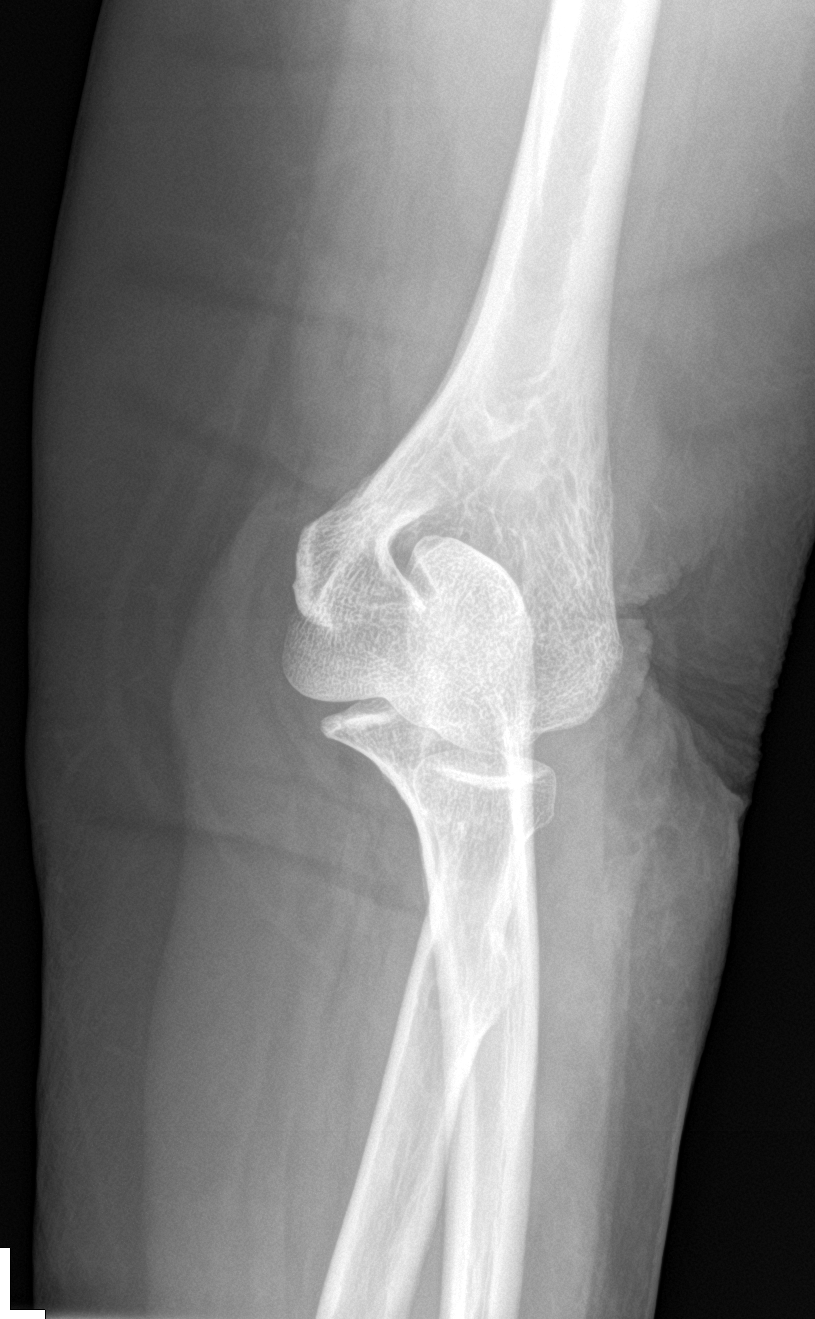

[elbow obl (2 of 2)]
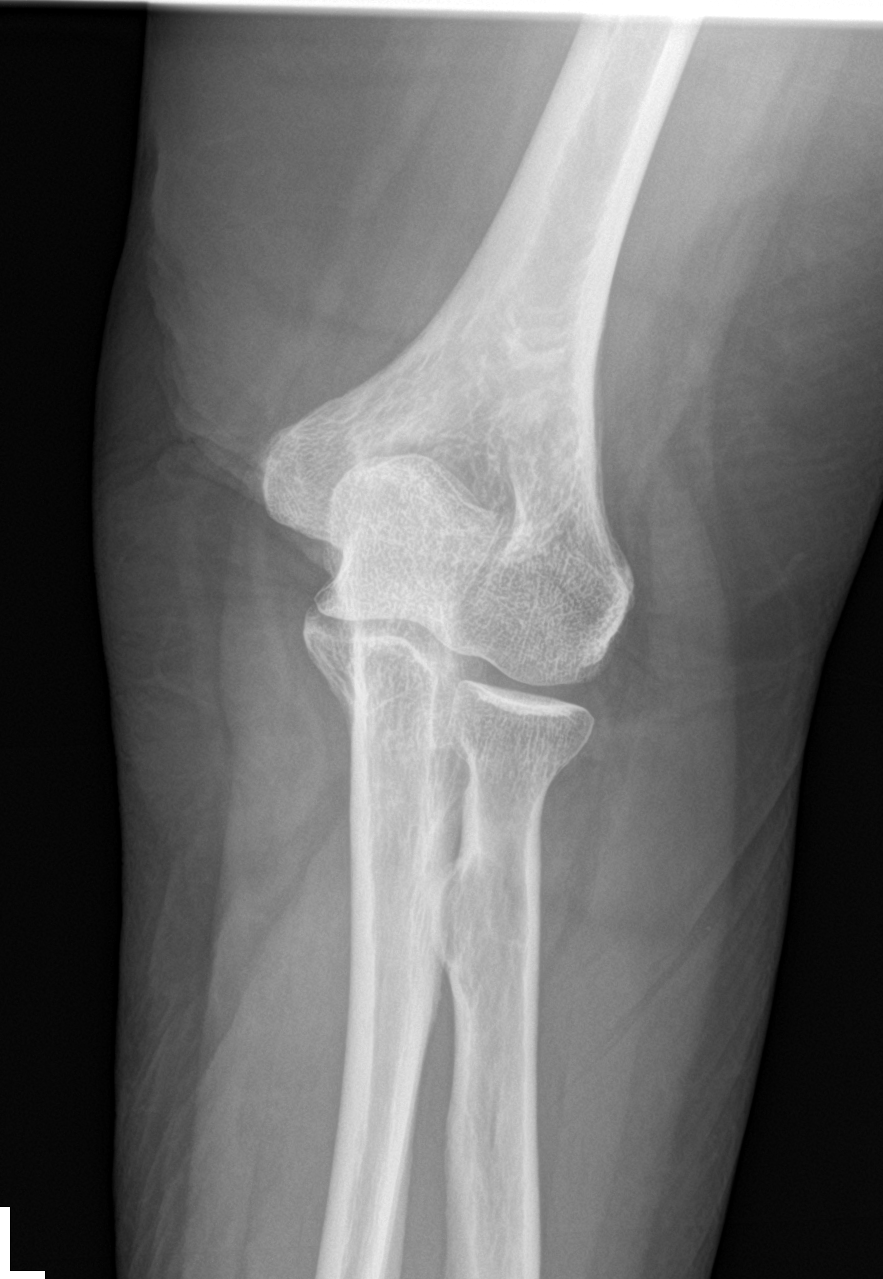

[2 of 2 positions shown; findings below may reference images not displayed]

FINDINGS: The elbow joint is maintained. No significant degenerative changes
or osteochondral abnormality. No acute fracture or joint effusion.
IMPRESSION: No acute bony findings.

## 2020-06-06 IMAGING — CT CT HEAD W/O CM
3 of 4 series · 15 of 47 positions shown, 18 images · non-contrast
Comparison: [DATE].

CLINICAL DATA: Multiple falls.

EXAM:
CT HEAD WITHOUT CONTRAST
CT CERVICAL SPINE WITHOUT CONTRAST
TECHNIQUE: Multidetector CT imaging of the head and cervical spine was
performed following the standard protocol without intravenous
contrast. Multiplanar CT image reconstructions of the cervical spine
were also generated.

[Series 4: head 2.0 h70h · axial · 0.38mm/px · z∈[-145,-1]mm · 9 of 90 slices shown, 12 images]
[im 9/90  brain]
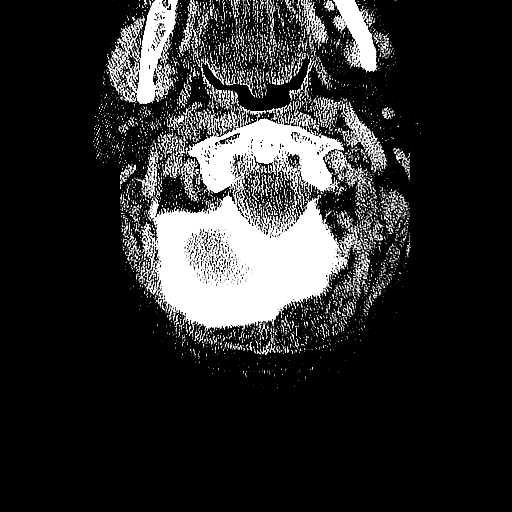
[im 9/90  bone]
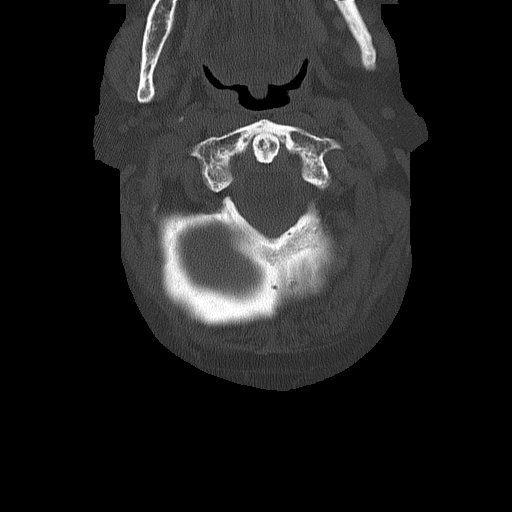
[im 18/90  brain]
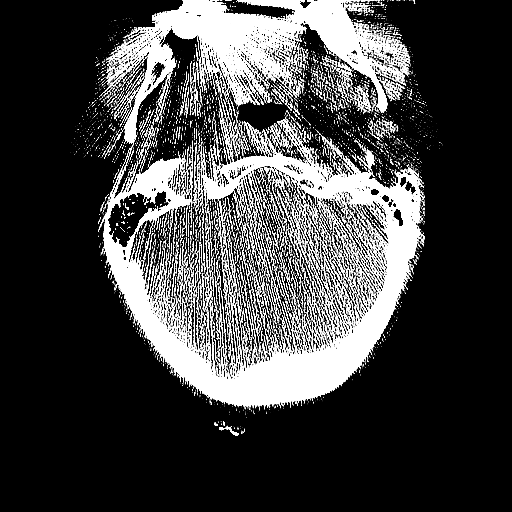
[im 27/90  brain]
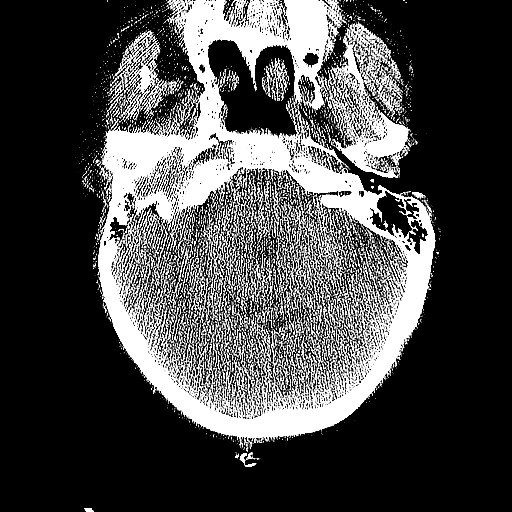
[im 36/90  brain]
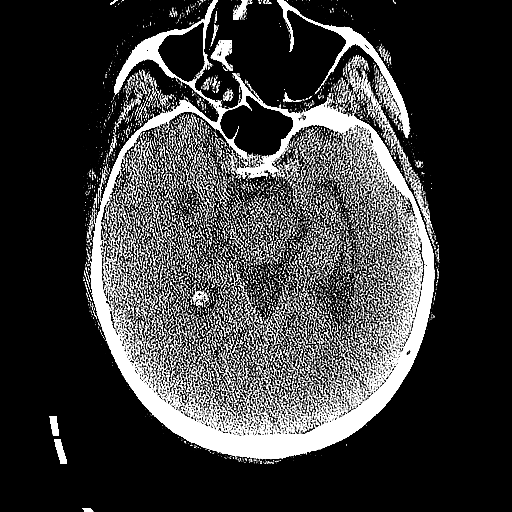
[im 45/90  brain]
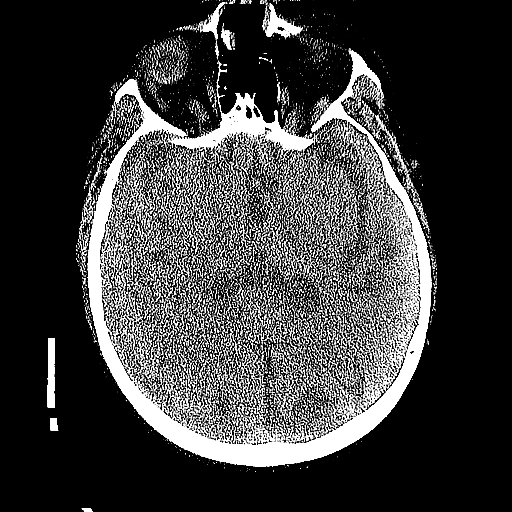
[im 45/90  bone]
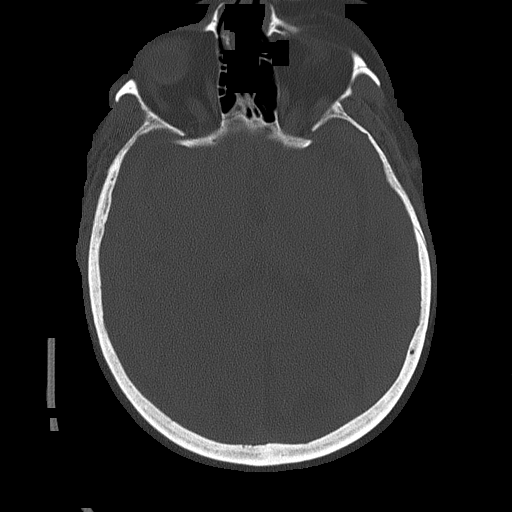
[im 54/90  brain]
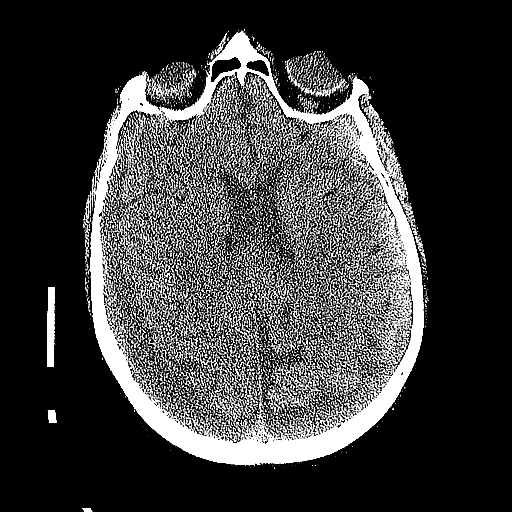
[im 63/90  brain]
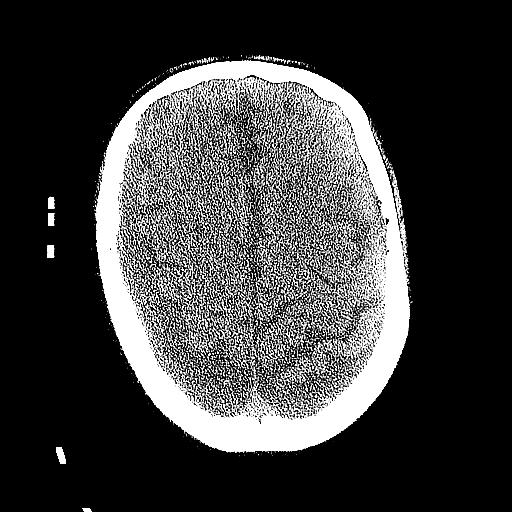
[im 72/90  brain]
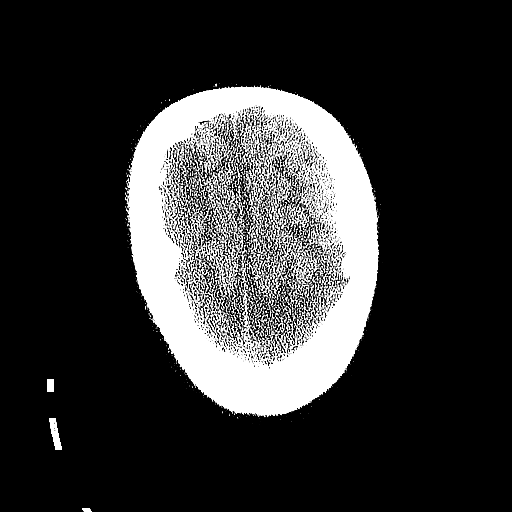
[im 81/90  brain]
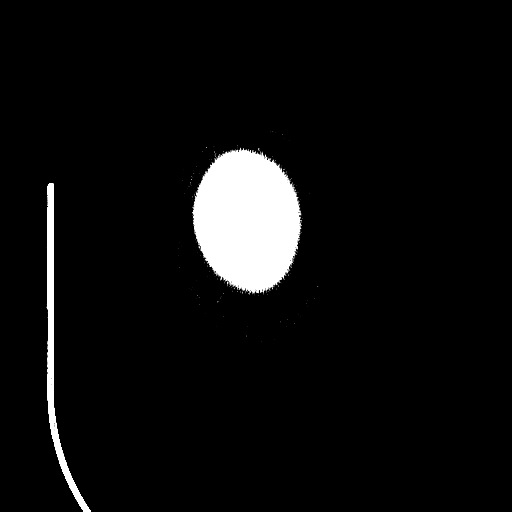
[im 81/90  bone]
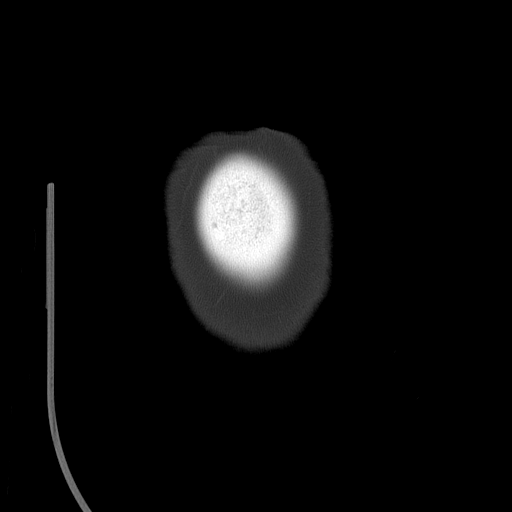

[Series 5: head 3.0 mpr cor · coronal · 0.35mm/px · 3 of 68 slices shown]
[im 23/68  brain]
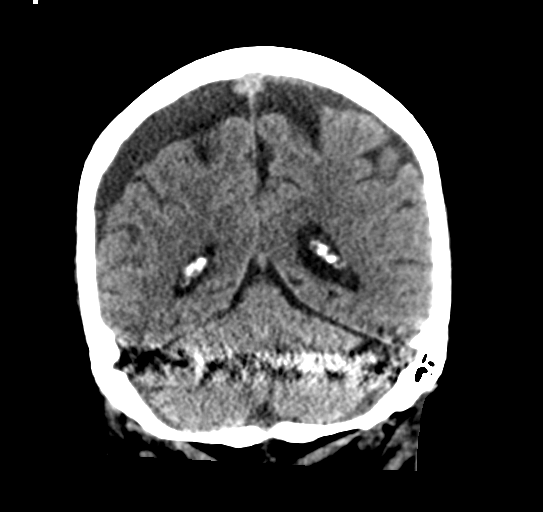
[im 30/68  brain]
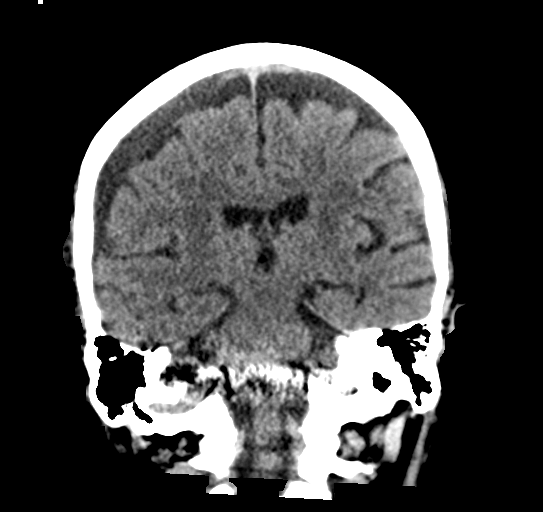
[im 38/68  brain]
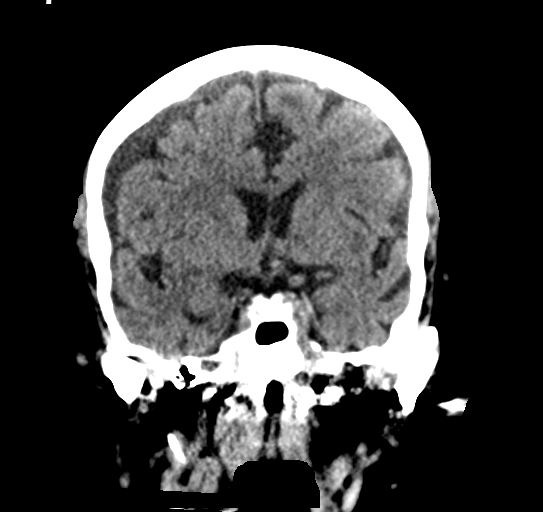

[Series 6: head 3.0 mpr sag · sagittal · 0.35mm/px · 3 of 54 slices shown]
[im 18/54  brain]
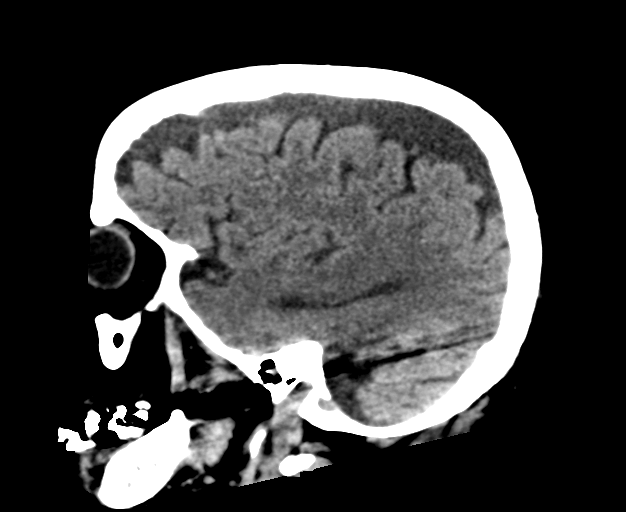
[im 27/54  brain]
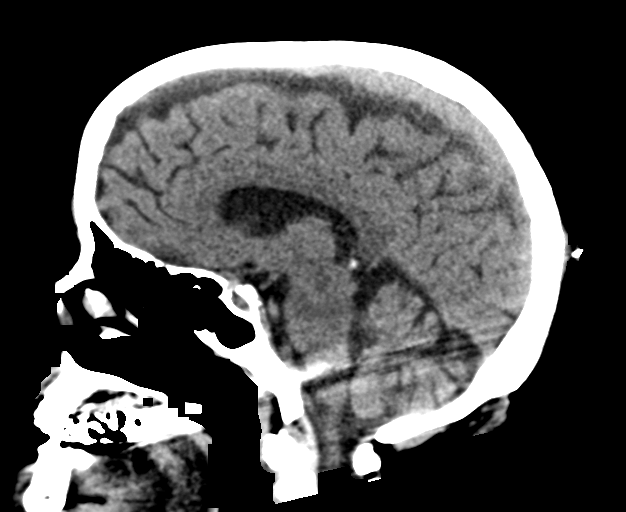
[im 36/54  brain]
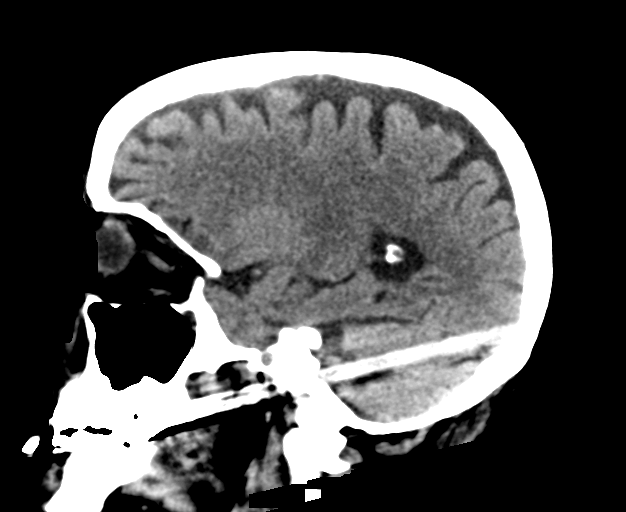

[15 of 47 positions shown; findings below may reference images not displayed]

FINDINGS: CT HEAD FINDINGS

Brain: There is no evidence of acute hemorrhage, acute infarction or
mass lesion. There does appear to be increased bilateral subdural
hygromas particularly over the superior portion of both cerebral
cortices, right greater than left. Ventricular size is within normal
limits.

Vascular: No hyperdense vessel or unexpected calcification.

Skull: Normal. Negative for fracture or focal lesion.

Sinuses/Orbits: No acute finding.

Other: None.

CT CERVICAL SPINE FINDINGS

Alignment: Normal.

Skull base and vertebrae: No acute fracture. No primary bone lesion
or focal pathologic process.

Soft tissues and spinal canal: No prevertebral fluid or swelling. No
visible canal hematoma.

Disc levels: Moderate degenerative disc disease is noted at C4-5,
C5-6 and C6-7.

Upper chest: Negative.

Other: None.
IMPRESSION: Bilateral subdural hygromas are noted which are enlarged compared to
prior exam, right greater than left. Mild diffuse cortical atrophy
is noted. No other definite abnormality is noted.

Moderate multilevel degenerative disc disease is noted. No acute
abnormality seen in the cervical spine.

## 2020-06-06 IMAGING — CR DG CHEST 2V
2 series · 2 of 2 positions shown · non-contrast
Comparison: [DATE]

CLINICAL DATA: Trauma multiple falls today.

EXAM:
CHEST - 2 VIEW

[chest lat]
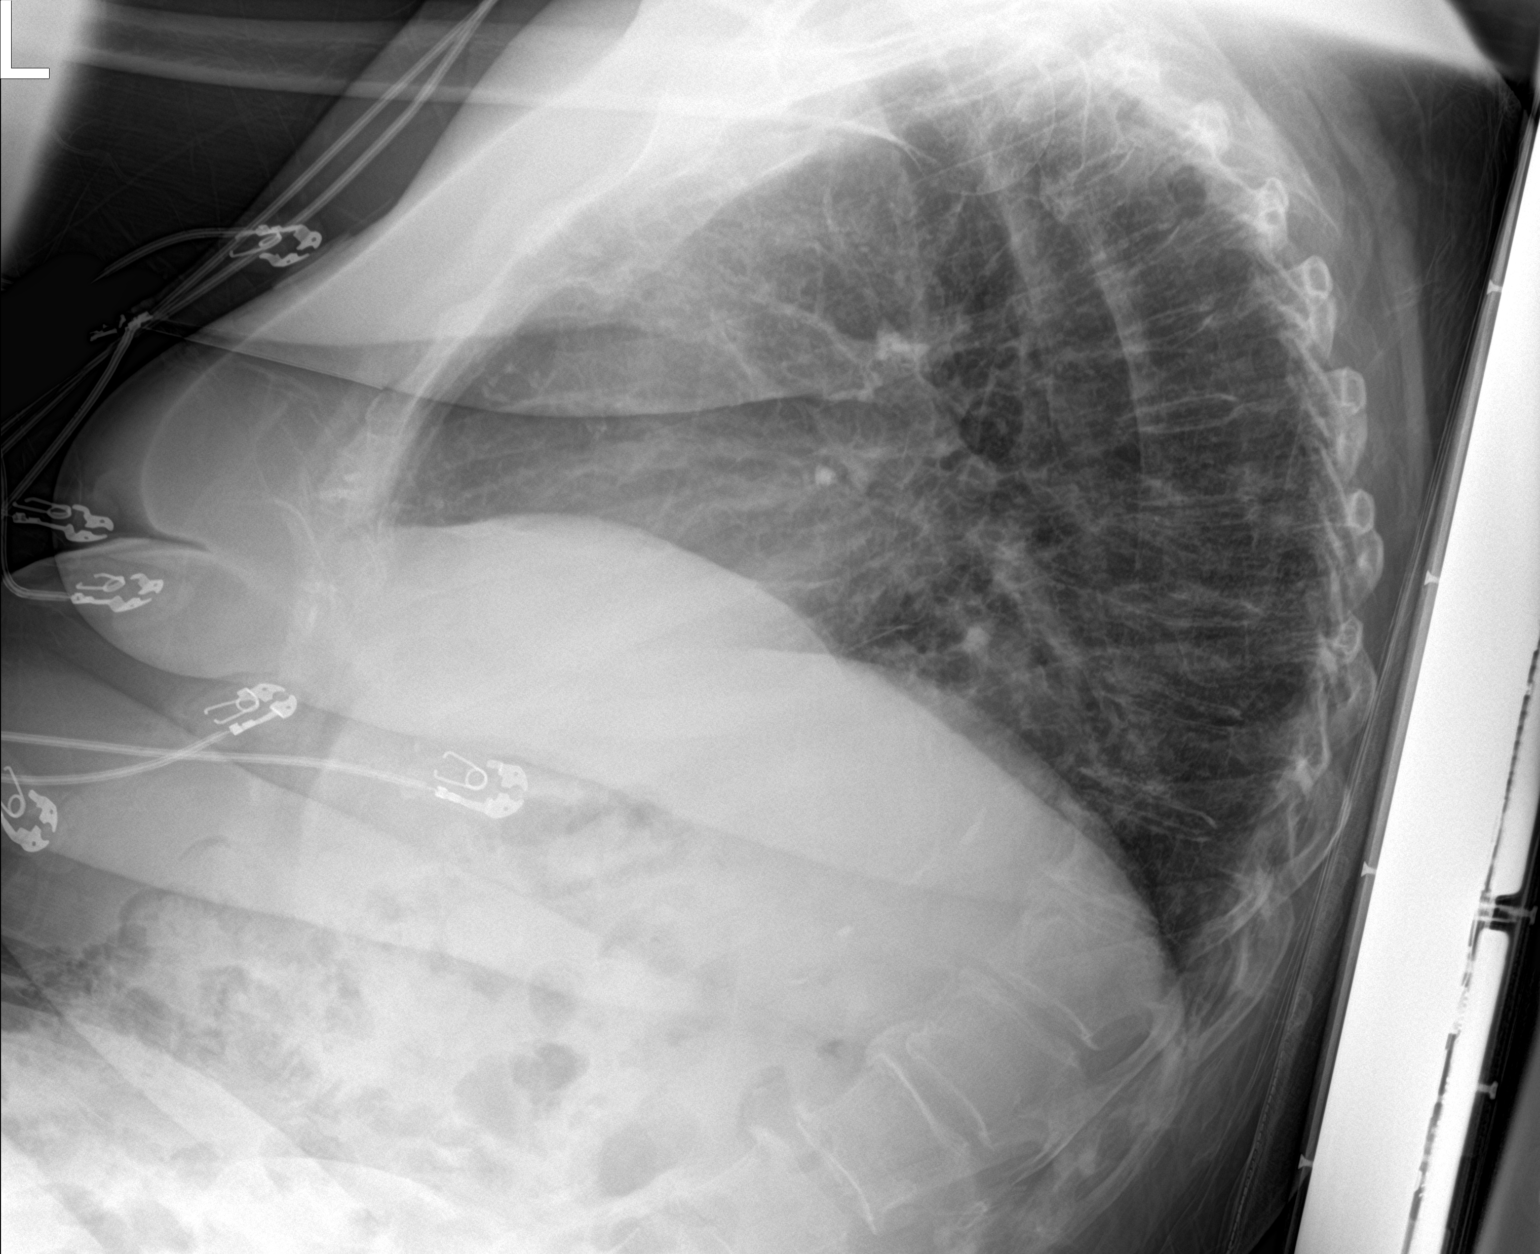

[chest ap]
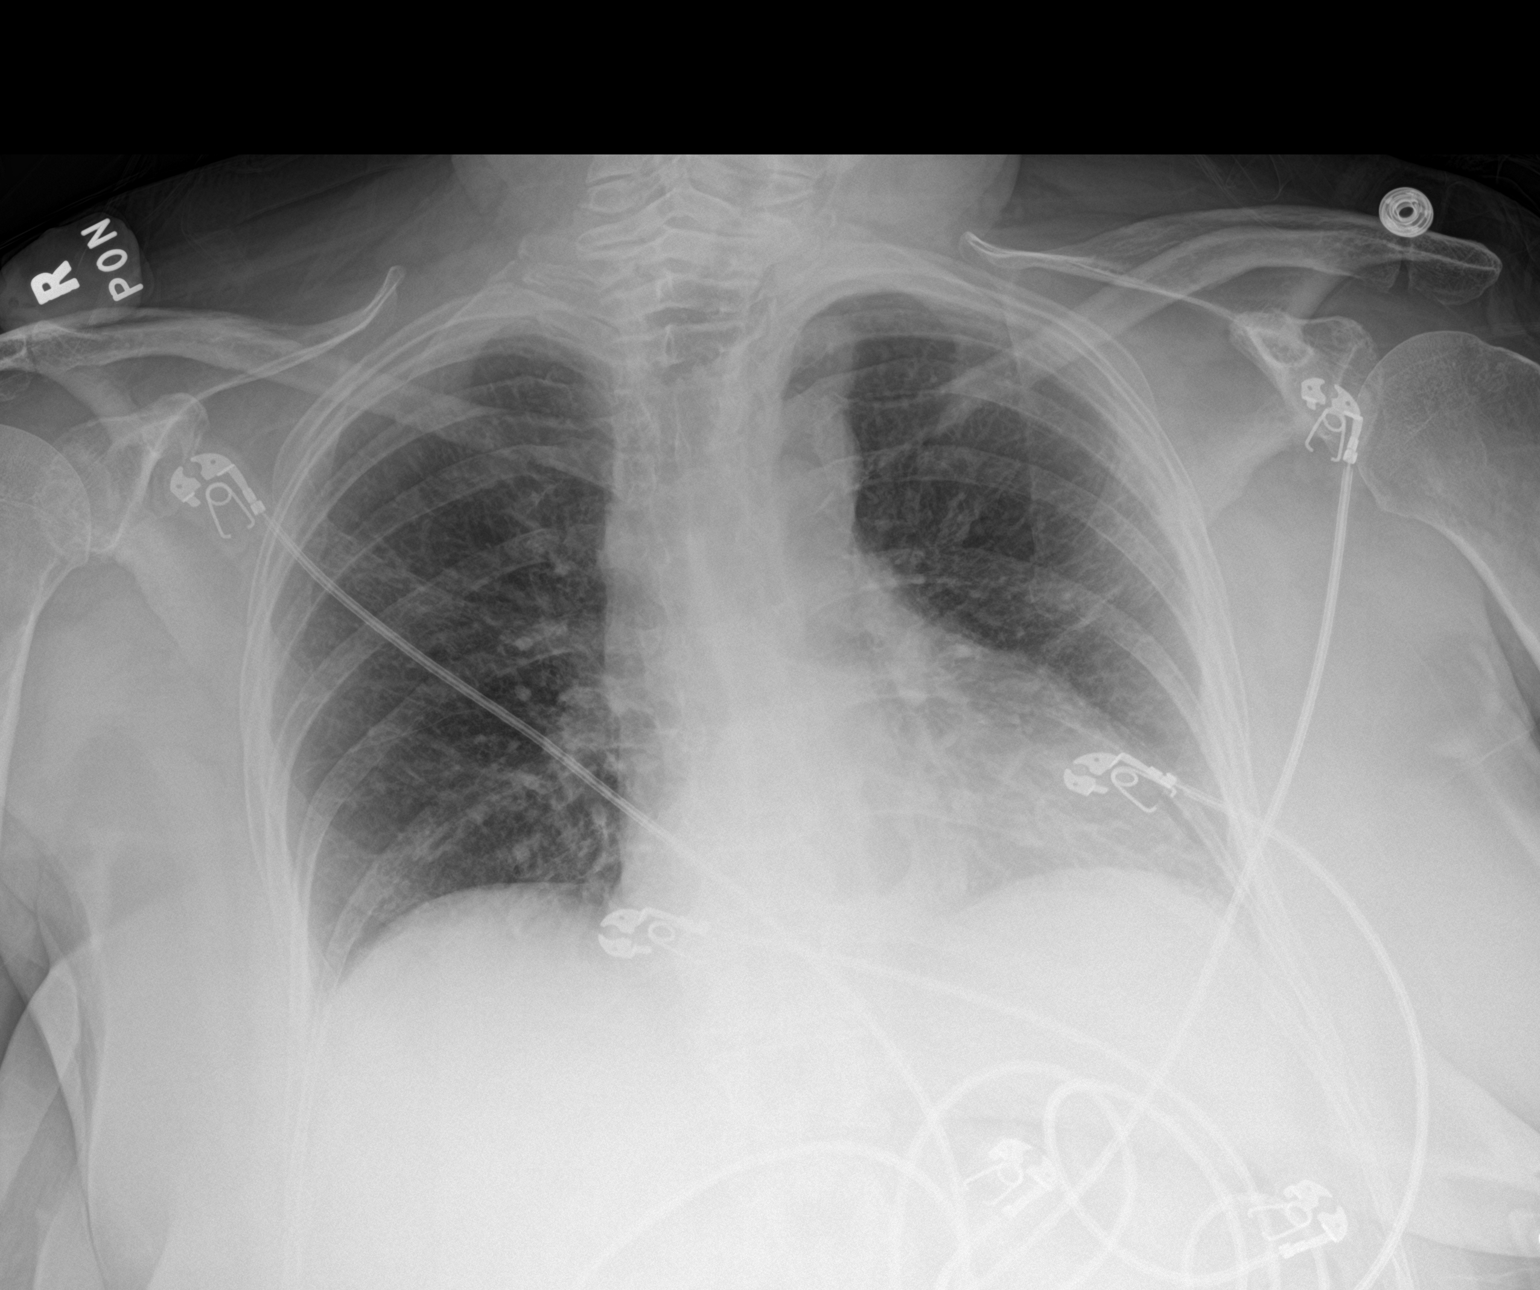

[2 of 2 positions shown; findings below may reference images not displayed]

FINDINGS: EKG leads project over the chest.

Mild rotation on the current study to LEFT.

Cardiomediastinal contours are stable compared to prior imaging.

Improved aeration at the LEFT lung base compared to prior imaging.
Mild retrocardiac opacification.

No visible pneumothorax.  The no sign of pleural effusion.

On limited assessment no acute skeletal process.
IMPRESSION: 1. Improved aeration at the LEFT lung base compared to prior
imaging. This may reflect resolving infection or volume loss with
improvement since the prior study.

## 2020-06-06 IMAGING — CR DG PELVIS 1-2V
1 series · 1 of 1 positions shown · non-contrast
Comparison: Scout radiograph from [DATE] small bowel series

CLINICAL DATA: Pelvic pain following multiple falls today.

EXAM:
PELVIS - 1-2 VIEW

[pelvis ap]
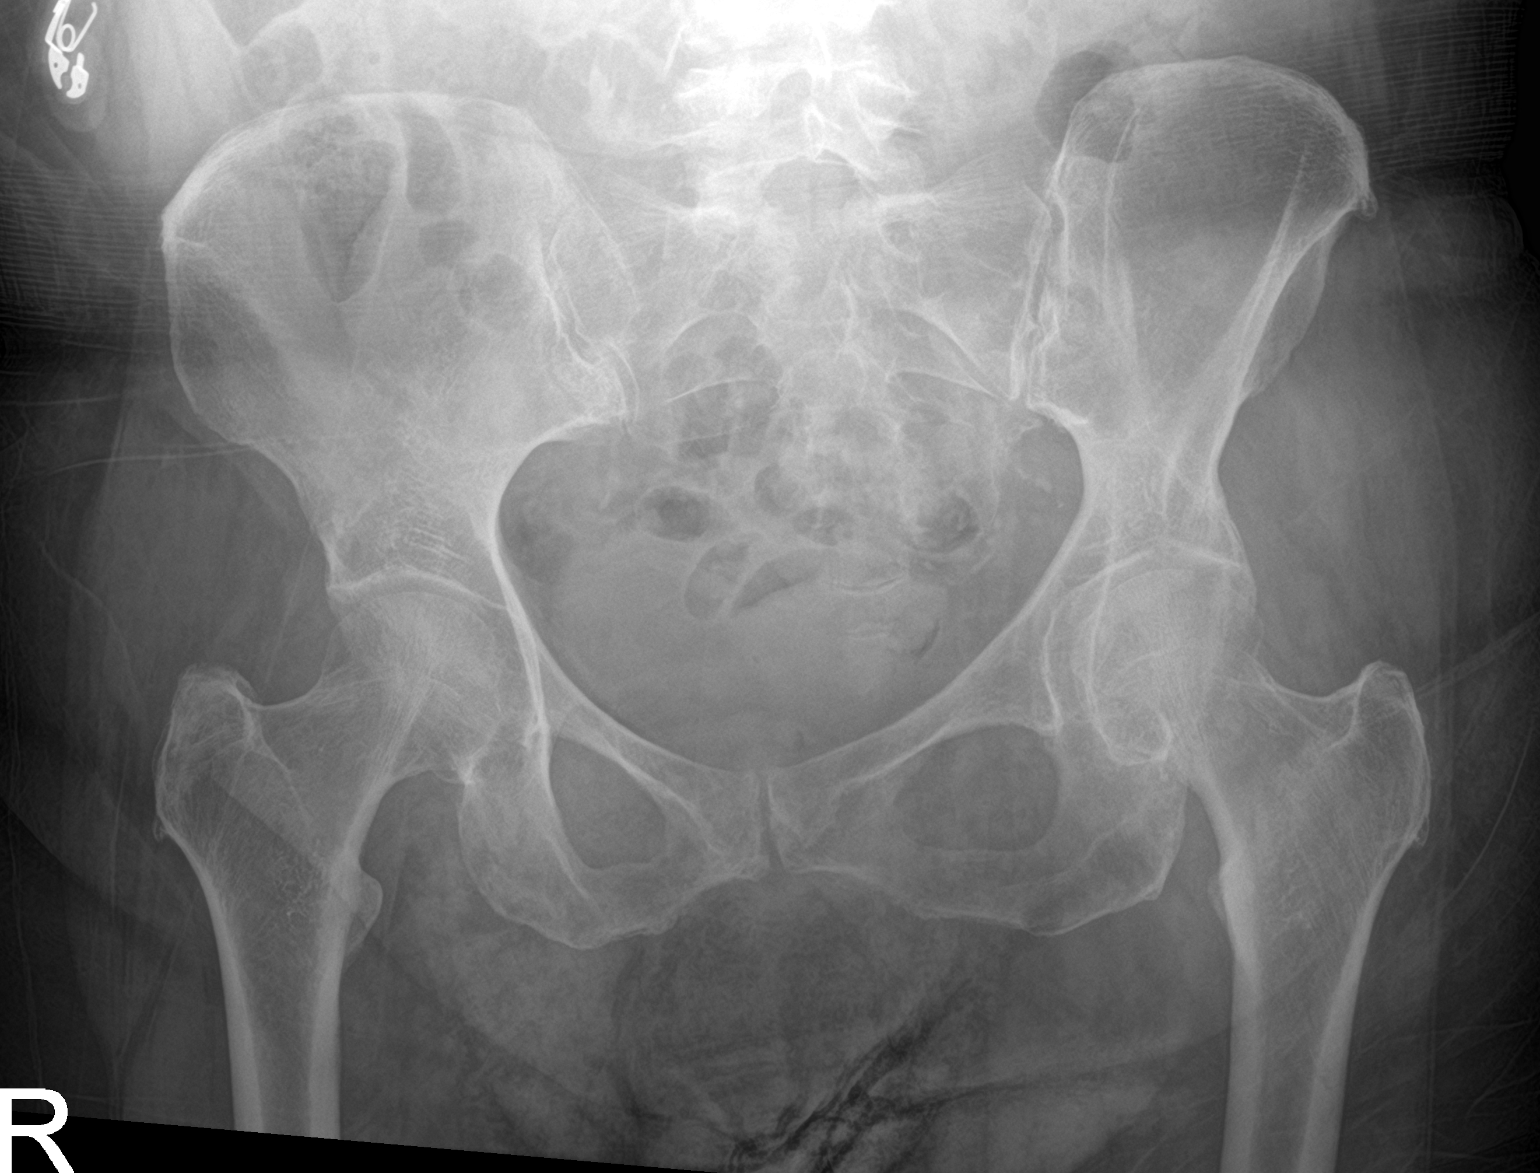

[1 of 1 positions shown; findings below may reference images not displayed]

FINDINGS: There is some irregularity/apparent acute angulation of the left
femoral cortex near the head neck junction although assessment is
limited by the projection on this single image and the appearance
could be secondary to obliquity with overlapping structures as well
as femoral head spurring. The femoral heads are approximated with
the acetabula. There is no pelvic diastasis.
IMPRESSION: Cannot exclude a nondisplaced left femoral neck fracture. Recommend
dedicated left hip radiographs for further evaluation.

## 2020-06-06 IMAGING — DX DG HIP (WITH OR WITHOUT PELVIS) 2-3V*L*
3 series · 3 of 3 positions shown · non-contrast
Comparison: [DATE]

CLINICAL DATA: Multiple fall

EXAM:
DG HIP (WITH OR WITHOUT PELVIS) 2-3V LEFT

[pelvis ap]
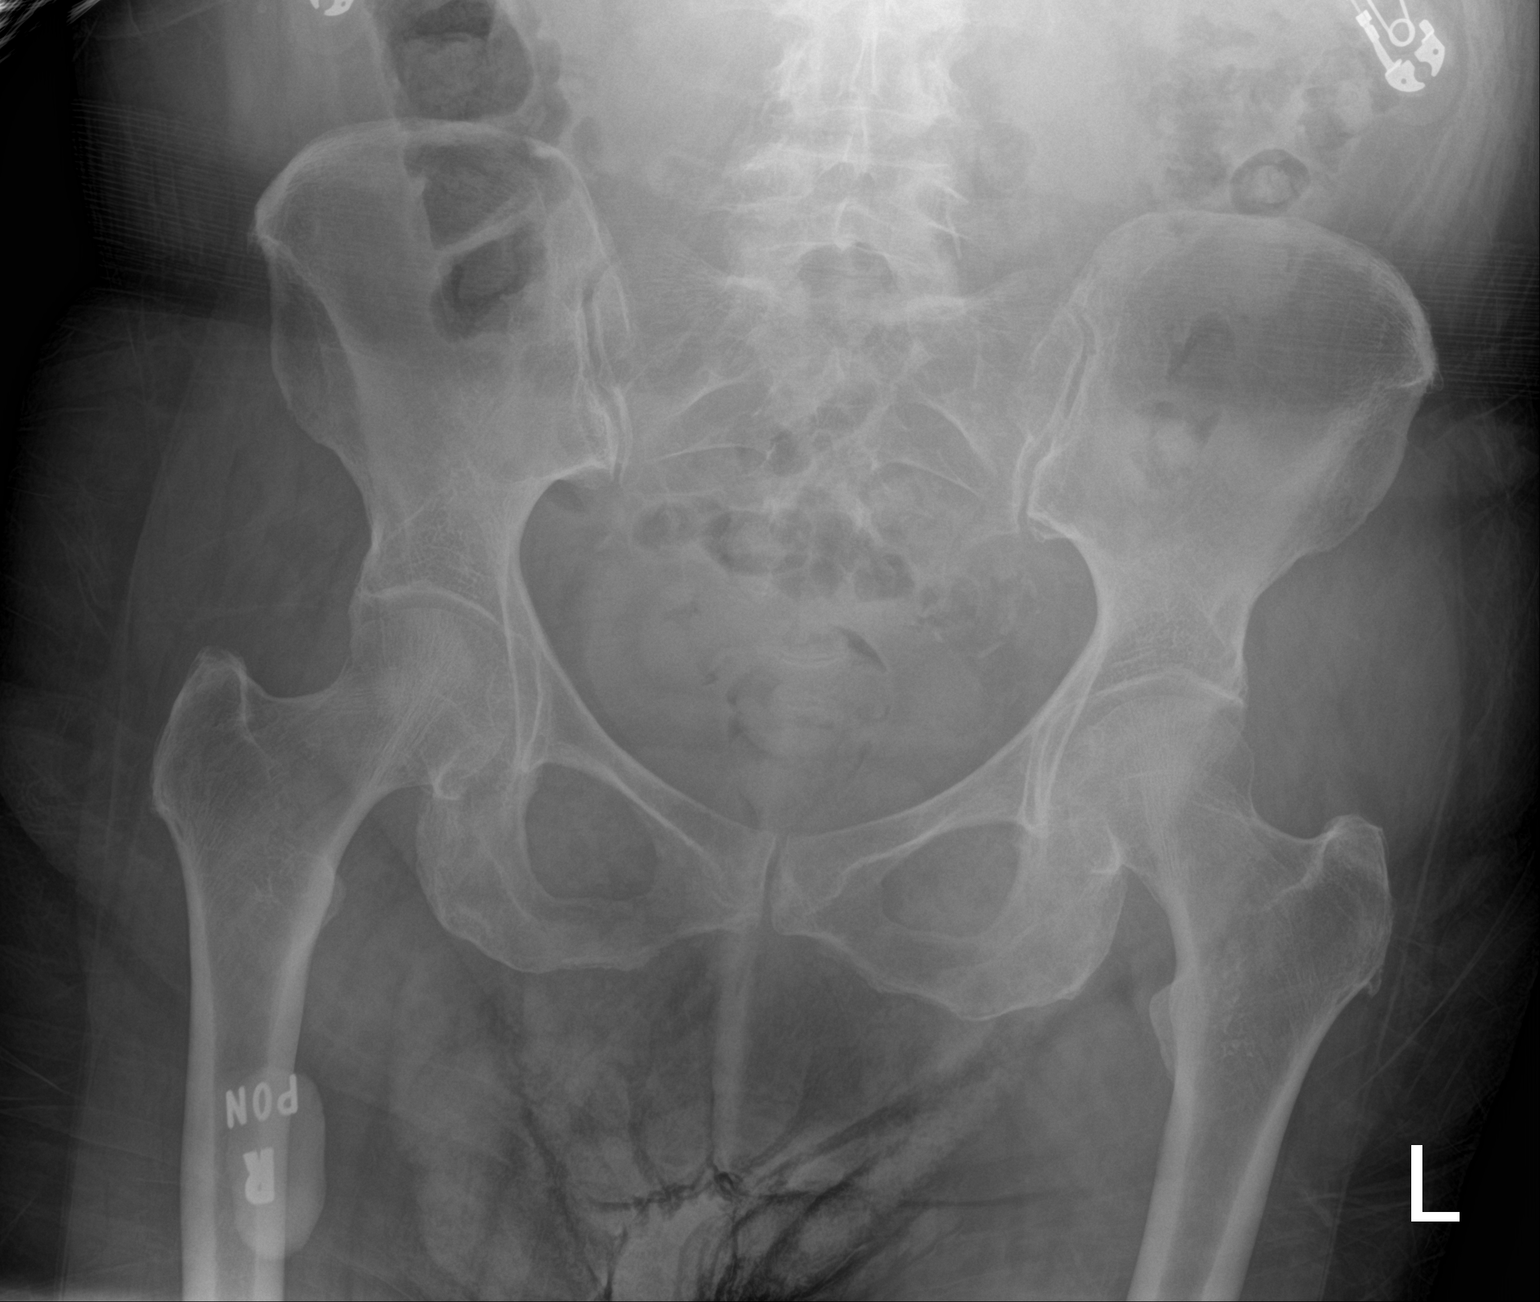

[hip ap]
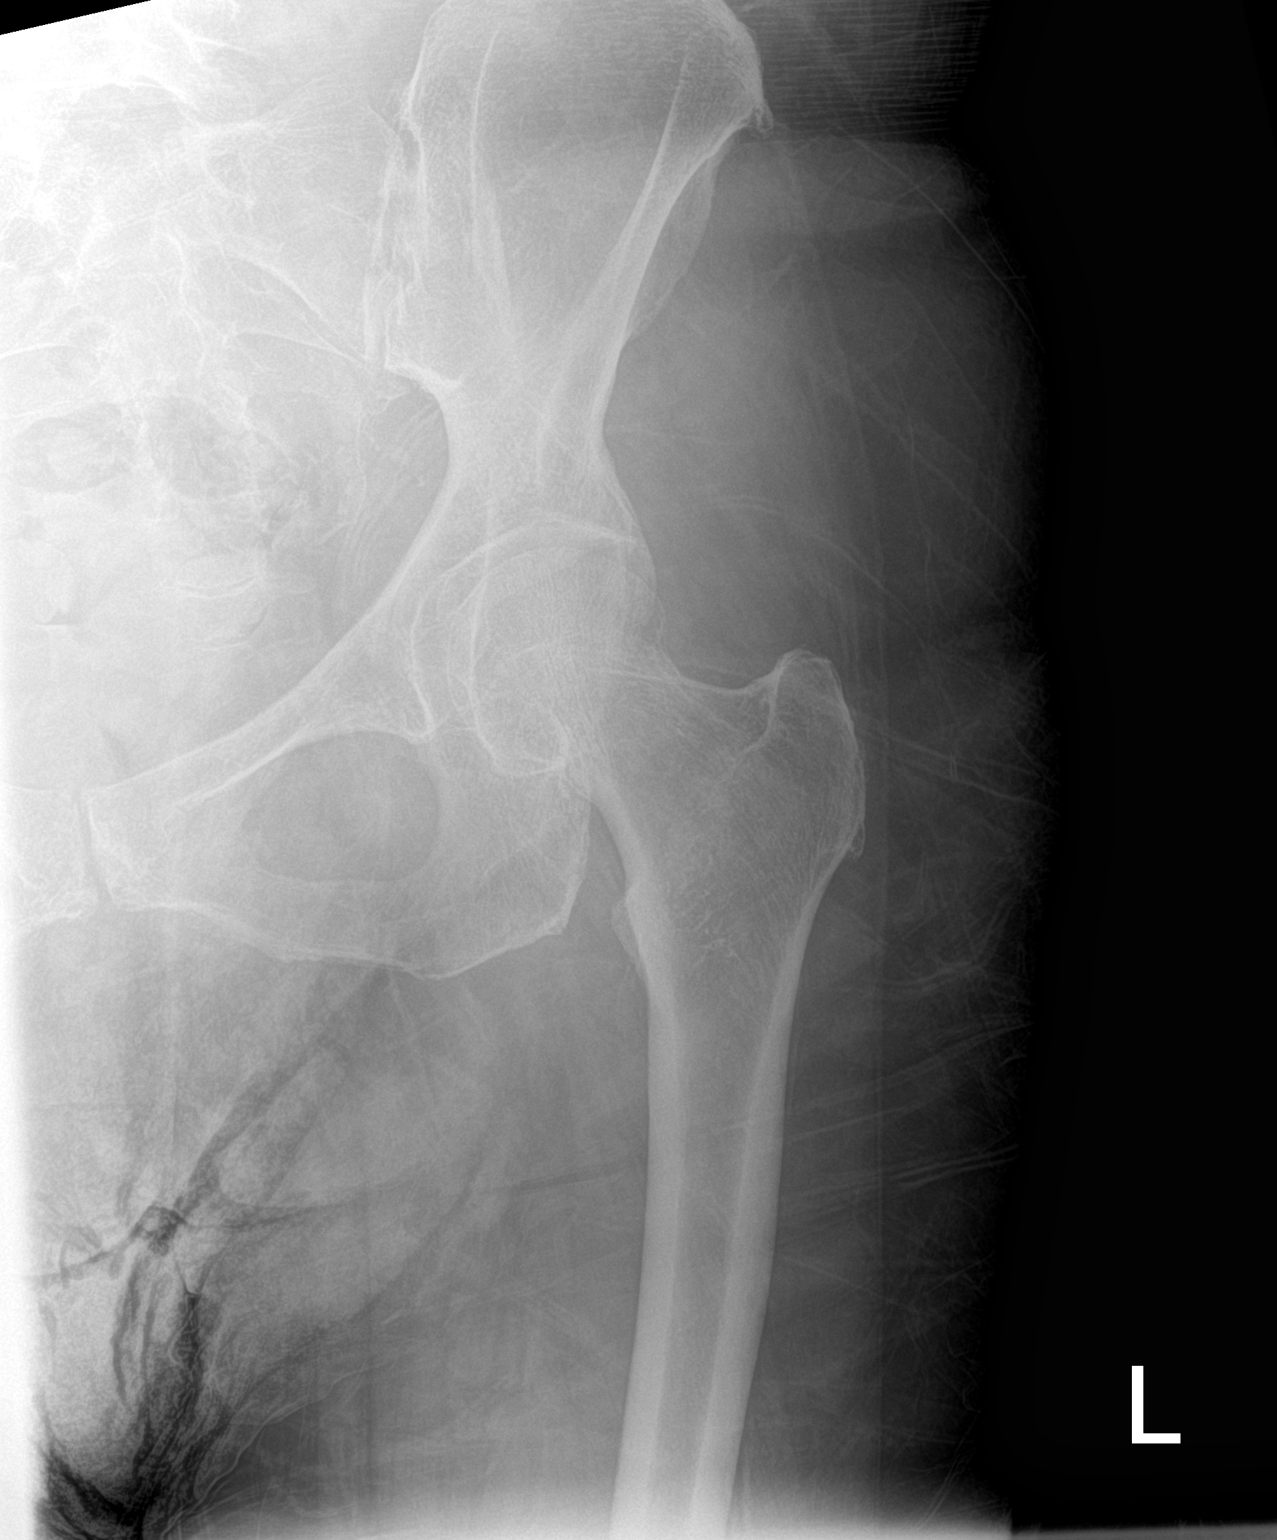

[hip lat]
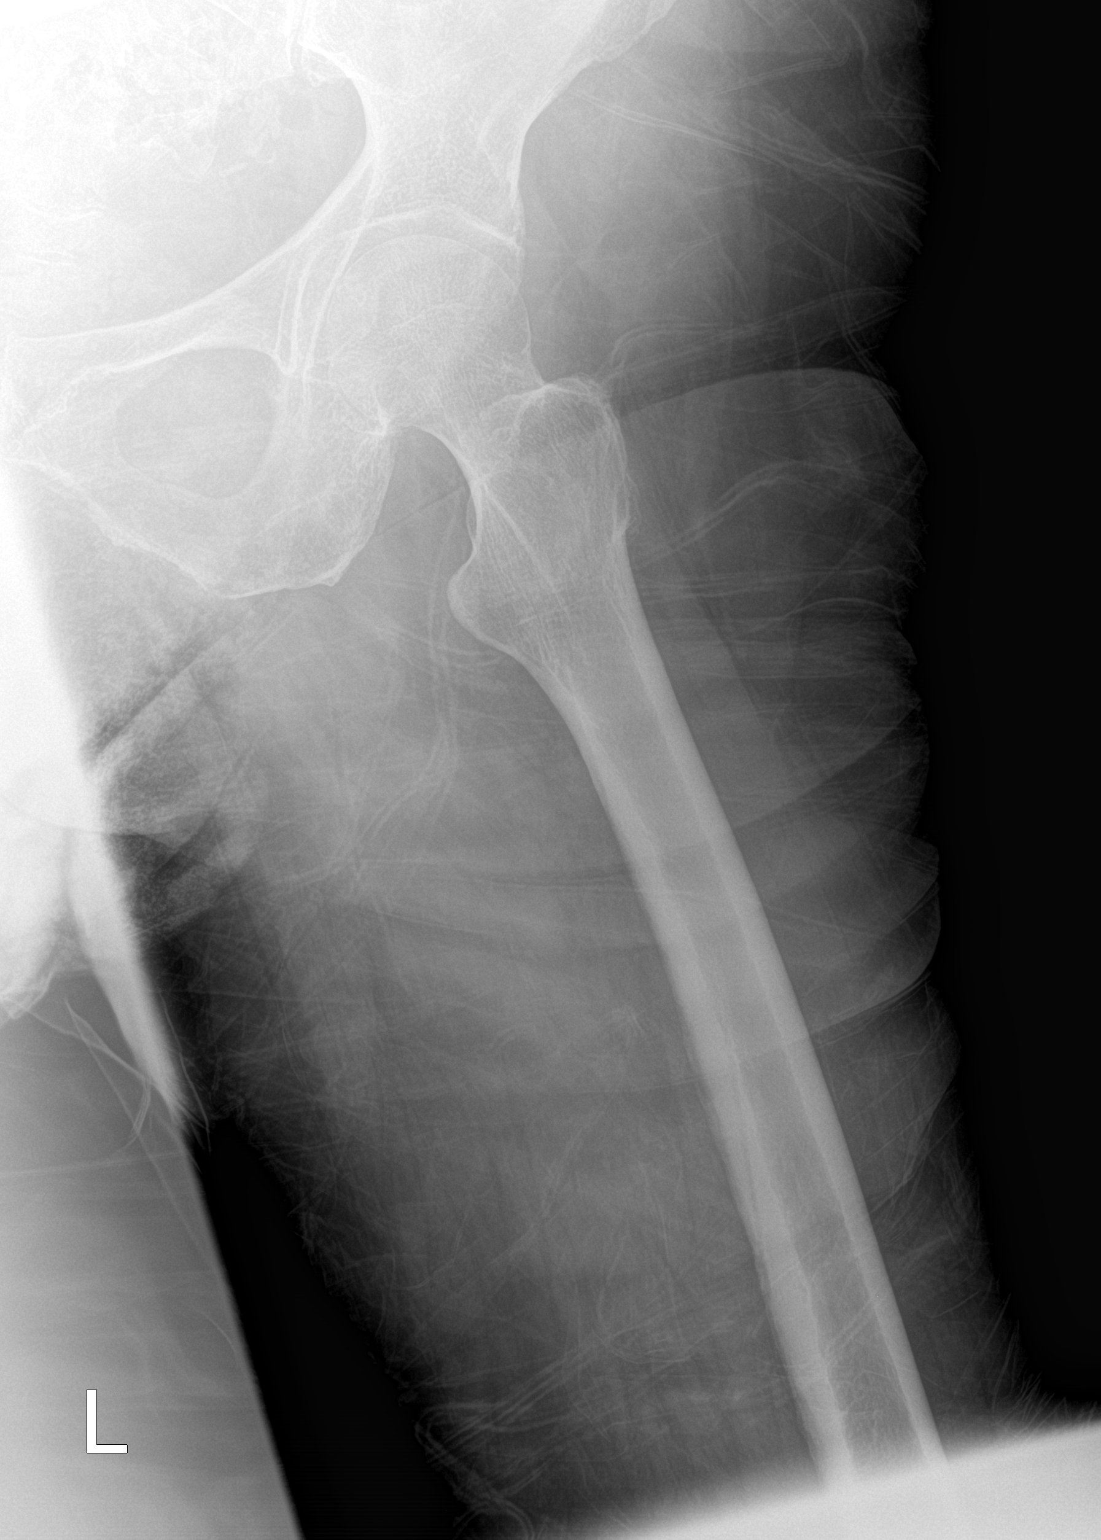

[3 of 3 positions shown; findings below may reference images not displayed]

FINDINGS: SI joints are non widened. Pubic symphysis and rami appear intact.
No definitive fracture or malalignment is seen.
IMPRESSION: No definite acute osseous abnormality.

## 2020-06-06 IMAGING — CT CT CERVICAL SPINE W/O CM
3 of 4 series · 12 of 33 positions shown, 14 images · non-contrast
Comparison: [DATE].

CLINICAL DATA: Multiple falls.

EXAM:
CT HEAD WITHOUT CONTRAST
CT CERVICAL SPINE WITHOUT CONTRAST
TECHNIQUE: Multidetector CT imaging of the head and cervical spine was
performed following the standard protocol without intravenous
contrast. Multiplanar CT image reconstructions of the cervical spine
were also generated.

[Series 5: c_spine 2.0 st · axial · 0.40mm/px · z∈[-260,-146]mm · 4 of 87 slices shown, 5 images]
[im 15/87  soft-tissue]
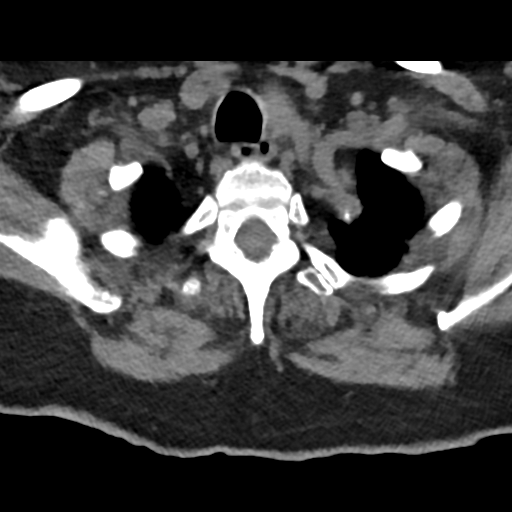
[im 15/87  bone]
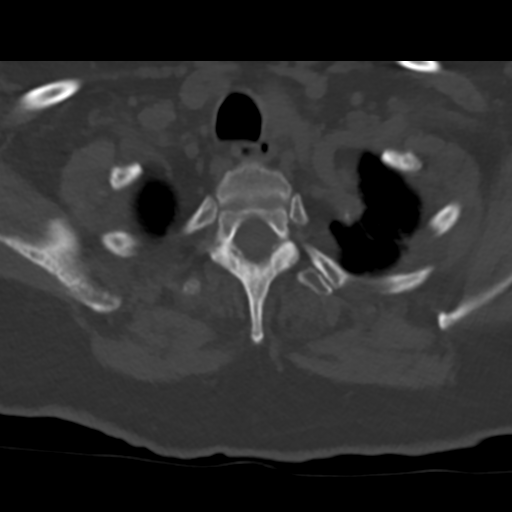
[im 29/87  bone]
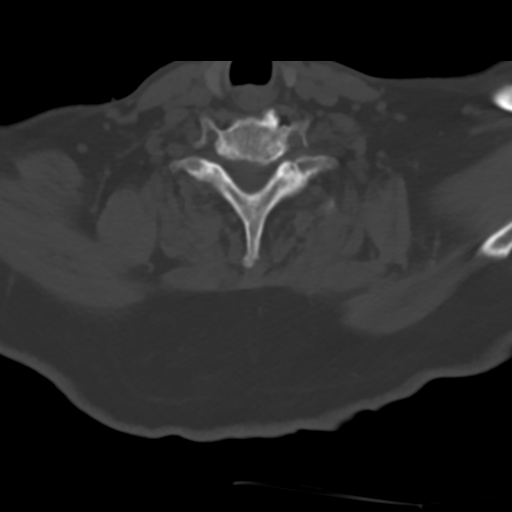
[im 58/87  bone]
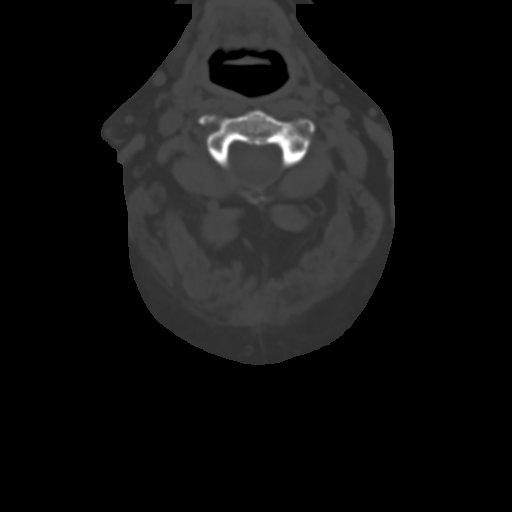
[im 72/87  bone]
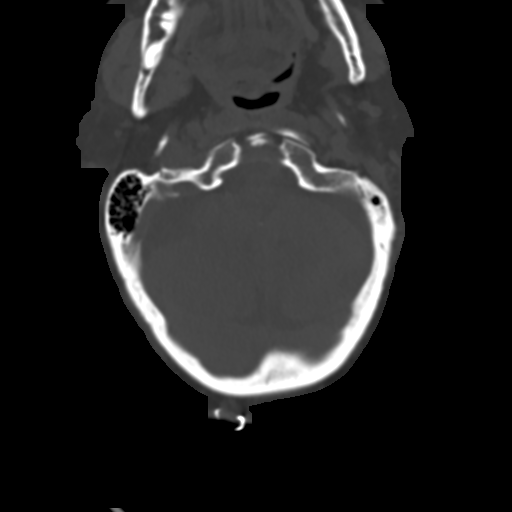

[Series 8: coronal bone · coronal · 0.26mm/px · 3 of 69 slices shown]
[im 14/69  bone]
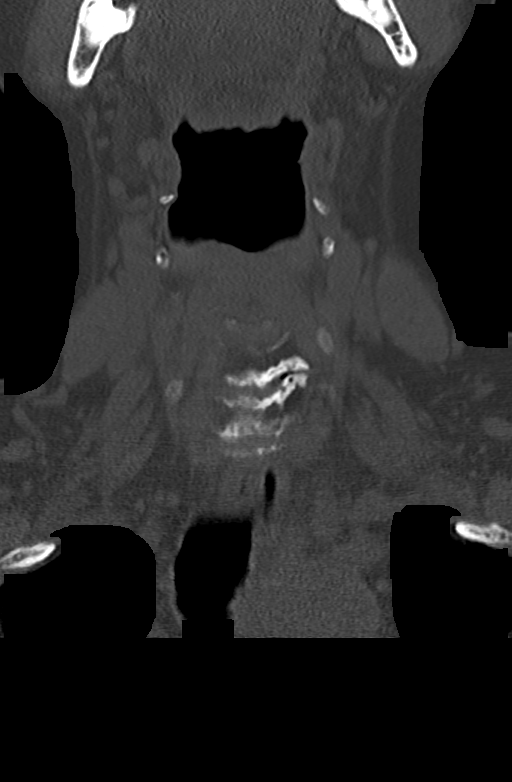
[im 28/69  bone]
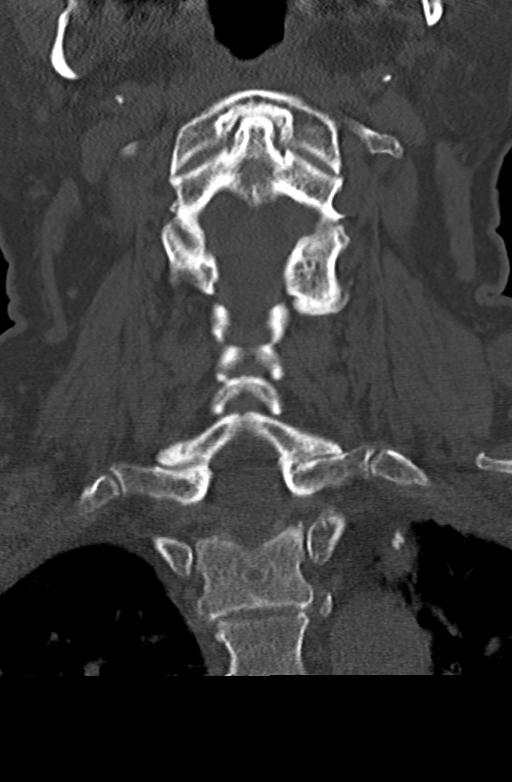
[im 41/69  bone]
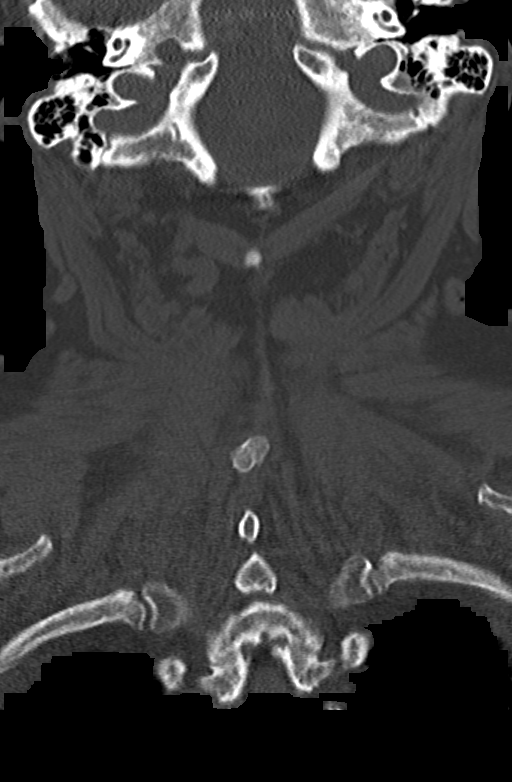

[Series 9: sagittal bone · sagittal · 0.26mm/px · 5 of 69 slices shown, 6 images]
[im 23/69  bone]
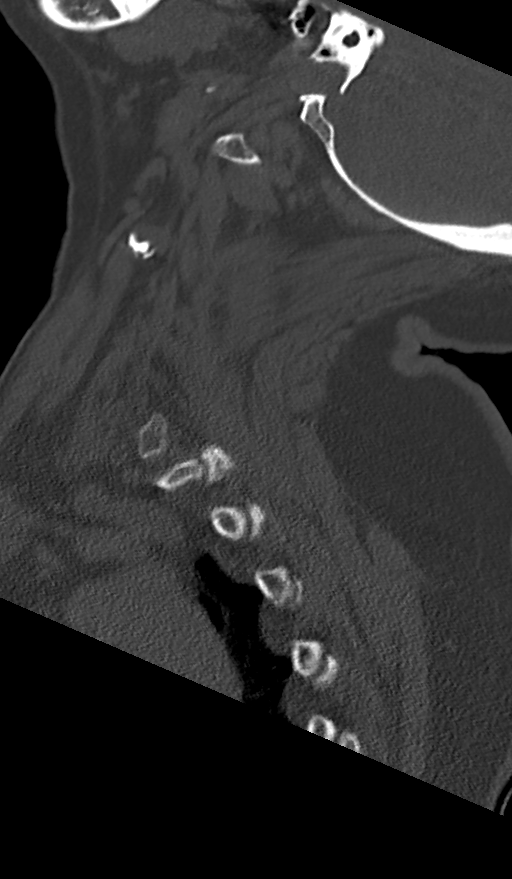
[im 29/69  bone]
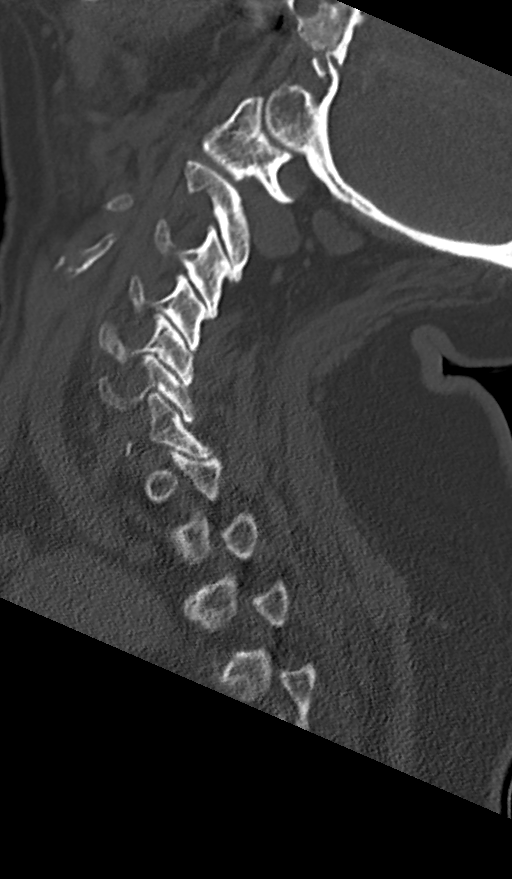
[im 35/69  soft-tissue]
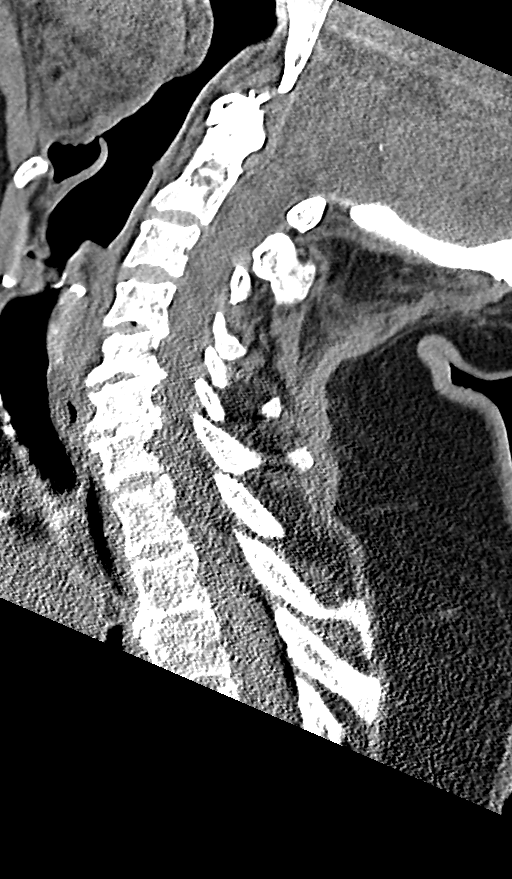
[im 35/69  bone]
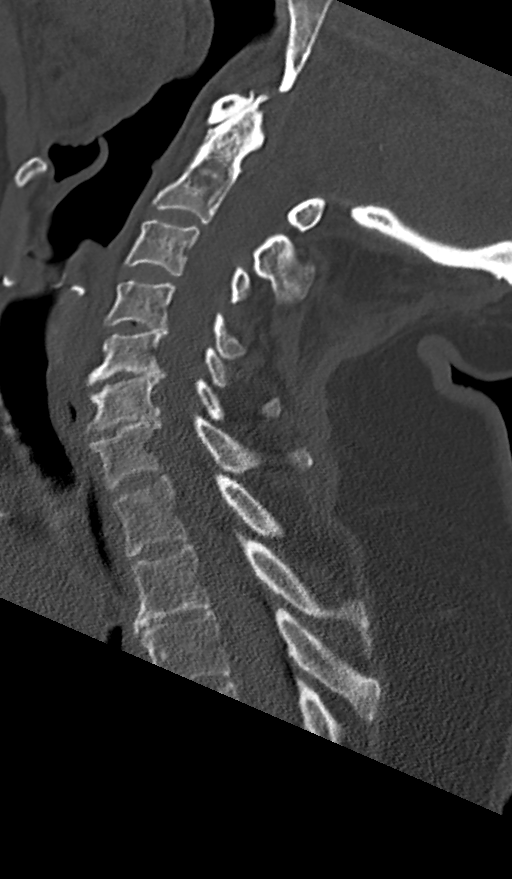
[im 40/69  bone]
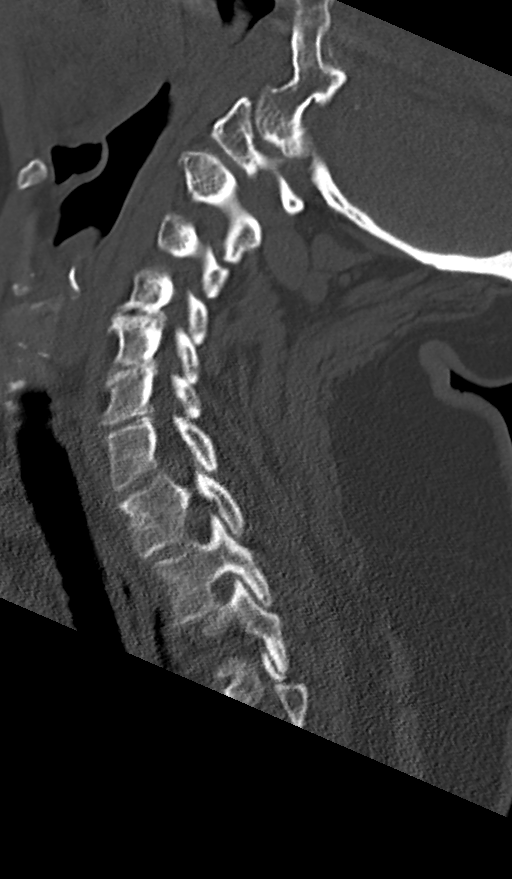
[im 46/69  bone]
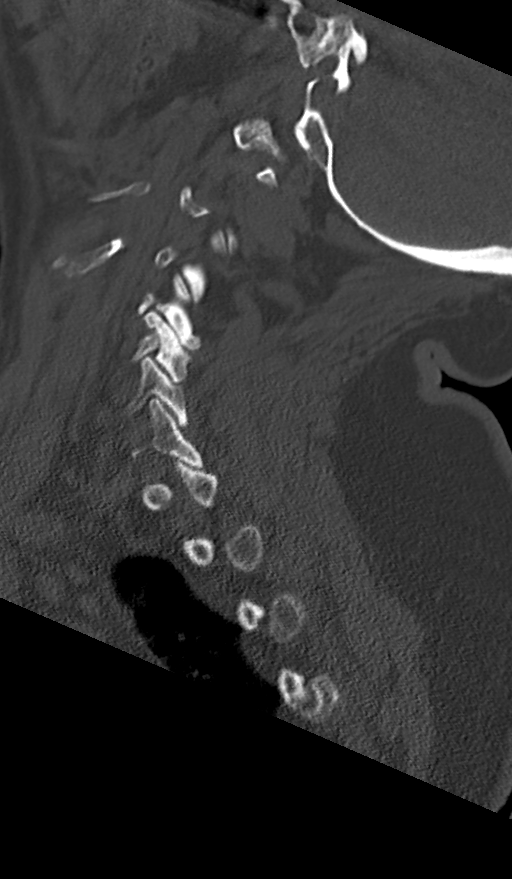

[12 of 33 positions shown; findings below may reference images not displayed]

FINDINGS: CT HEAD FINDINGS

Brain: There is no evidence of acute hemorrhage, acute infarction or
mass lesion. There does appear to be increased bilateral subdural
hygromas particularly over the superior portion of both cerebral
cortices, right greater than left. Ventricular size is within normal
limits.

Vascular: No hyperdense vessel or unexpected calcification.

Skull: Normal. Negative for fracture or focal lesion.

Sinuses/Orbits: No acute finding.

Other: None.

CT CERVICAL SPINE FINDINGS

Alignment: Normal.

Skull base and vertebrae: No acute fracture. No primary bone lesion
or focal pathologic process.

Soft tissues and spinal canal: No prevertebral fluid or swelling. No
visible canal hematoma.

Disc levels: Moderate degenerative disc disease is noted at C4-5,
C5-6 and C6-7.

Upper chest: Negative.

Other: None.
IMPRESSION: Bilateral subdural hygromas are noted which are enlarged compared to
prior exam, right greater than left. Mild diffuse cortical atrophy
is noted. No other definite abnormality is noted.

Moderate multilevel degenerative disc disease is noted. No acute
abnormality seen in the cervical spine.

## 2020-06-06 MED ORDER — CEPHALEXIN 500 MG PO CAPS: 1000.0000 mg | ORAL_CAPSULE | Freq: Two times a day (BID) | ORAL | 0 refills | Status: AC

## 2020-06-06 MED ORDER — CEPHALEXIN 250 MG PO CAPS
1000.0000 mg | ORAL_CAPSULE | Freq: Once | ORAL | Status: AC
  Administered 2020-06-06: 1000 mg via ORAL
  Filled 2020-06-06: qty 4

## 2020-06-06 NOTE — ED Notes (Signed)
Pt was able to ambulate with standby assist

## 2020-06-06 NOTE — ED Notes (Signed)
[facility] called. Pt #8 on list

## 2020-06-06 NOTE — ED Provider Notes (Signed)
[facility] EMERGENCY DEPARTMENT Provider Note   CSN: [id number] Arrival date & time: 06/06/20  1155     History Chief Complaint  Patient presents with  . Fall    Leslie Rios is a 80 y.o. female.  Pt presents to the ED today with a fall.  The pt lives at a SNF and has a hx of frequent falls.  She has fallen again today.  She has dementia and does not remember what caused her to fall.  Pt was admitted from 4/8-13 for a fall thought to be due to severe anemia (hgb 4).  GI work up showed a large hiatal hernia.  Hgb on d/c was 9.0.  Pt also was noted to have bilateral subdural hematomas.  Pt c/o only of left elbow pain from her fall.         Past Medical History:  Diagnosis Date  . Alzheimer disease ([place])   . Cystitis   . GERD (gastroesophageal reflux disease)   . Hypo-osmolality and hyponatremia   . Renal disorder     Patient Active Problem List   Diagnosis Date Noted  . Hypoxia   . Acute pulmonary edema ([facility])   . Subdural hematoma ([place])   . Symptomatic anemia 05/26/2020    Past Surgical History:  Procedure Laterality Date  . ESOPHAGOGASTRODUODENOSCOPY (EGD) WITH PROPOFOL N/A 05/28/2020   Procedure: ESOPHAGOGASTRODUODENOSCOPY (EGD) WITH PROPOFOL;  Surgeon: Doran Stabler, MD;  Location: [facility];  Service: Gastroenterology;  Laterality: N/A;     OB History   No obstetric history on file.     No family history on file.  Social History   Tobacco Use  . Smoking status: Never Smoker  . Smokeless tobacco: Never Used  Vaping Use  . Vaping Use: Never used  Substance Use Topics  . Alcohol use: Never  . Drug use: Never    Home Medications Prior to Admission medications   Medication Sig Start Date End Date Taking? Authorizing Provider  atorvastatin (LIPITOR) 20 MG tablet Take 10 mg by mouth at bedtime. 05/14/20   [provider]  busPIRone (BUSPAR) 5 MG tablet Take 5 mg by mouth 2 (two) times daily. 05/10/20   [provider]  clotrimazole-betamethasone (LOTRISONE) cream Apply 1 application topically 2 (two) times daily. Apply to yeast rash at groin. 10/04/19 10/03/20  [provider]  furosemide (LASIX) 20 MG tablet Take 2 tablets (40 mg total) by mouth daily for 2 days, THEN 1 tablet (20 mg total) daily for 28 days. 05/31/20 06/30/20  Cato Mulligan, MD  LORazepam (ATIVAN) 0.5 MG tablet Take 0.5 mg by mouth 2 (two) times daily as needed for anxiety. 04/27/20   [provider]  MYRBETRIQ 25 MG TB24 tablet Take 25 mg by mouth daily. 05/10/20   [provider]  NAMZARIC 28-10 MG CP24 Take 1 capsule by mouth at bedtime. 05/10/20   [provider]  ondansetron (ZOFRAN) 4 MG tablet Take 4 mg by mouth as needed for nausea/vomiting. 02/25/20   [provider]  pantoprazole (PROTONIX) 40 MG tablet Take 40 mg by mouth daily. 05/10/20   [provider]  traZODone (DESYREL) 50 MG tablet Take 50 mg by mouth at bedtime. 05/10/20   [provider]  venlafaxine XR (EFFEXOR-XR) 150 MG 24 hr capsule Take 150 mg by mouth daily. 05/10/20   [provider]    Allergies    Sulfa antibiotics and Sulfamethizole  Review of Systems   Review  of Systems  Neurological: Positive for weakness.  All other systems reviewed and are negative.   Physical Exam Updated Vital Signs BP (!) 125/57   Pulse 84   Temp 97.7 F (36.5 C) (Oral)   Resp 17   LMP  (LMP Unknown)   SpO2 95%   Physical Exam Vitals and nursing note reviewed.  Constitutional:      Appearance: Normal appearance.  HENT:     Head: Normocephalic and atraumatic.     Right Ear: External ear normal.     Left Ear: External ear normal.     Nose: Nose normal.     Mouth/Throat:     Mouth: Mucous membranes are moist.     Pharynx: Oropharynx is clear.  Eyes:     Extraocular Movements: Extraocular movements intact.     Conjunctiva/sclera: Conjunctivae normal.     Pupils: Pupils are equal, round, and  reactive to light.  Cardiovascular:     Rate and Rhythm: Normal rate and regular rhythm.     Pulses: Normal pulses.     Heart sounds: Normal heart sounds.  Pulmonary:     Effort: Pulmonary effort is normal.     Breath sounds: Normal breath sounds.  Abdominal:     General: Abdomen is flat. Bowel sounds are normal.     Palpations: Abdomen is soft.  Musculoskeletal:        General: Normal range of motion.     Cervical back: Normal range of motion and neck supple.     Comments: Good rom left elbow  Skin:    Capillary Refill: Capillary refill takes less than 2 seconds.     Comments: Small skin tear left elbow  Neurological:     General: No focal deficit present.     Mental Status: She is alert. Mental status is at baseline.  Psychiatric:        Mood and Affect: Mood normal.        Behavior: Behavior normal.     ED Results / Procedures / Treatments   Labs (all labs ordered are listed, but only abnormal results are displayed) Labs Reviewed  COMPREHENSIVE METABOLIC PANEL - Abnormal; Notable for the following components:      Result Value   Albumin 3.2 (*)    Alkaline Phosphatase 134 (*)    All other components within normal limits  CBC WITH DIFFERENTIAL/PLATELET - Abnormal; Notable for the following components:   Hemoglobin 11.1 (*)    MCV 79.5 (*)    MCH 23.3 (*)    MCHC 29.3 (*)    All other components within normal limits  URINALYSIS, ROUTINE W REFLEX MICROSCOPIC  TYPE AND SCREEN    EKG None  Radiology DG Chest 2 View  Result Date: 06/06/2020 CLINICAL DATA:  Trauma multiple falls today. EXAM: CHEST - 2 VIEW COMPARISON:  May 27, 2020 FINDINGS: EKG leads project over the chest. Mild rotation on the current study to LEFT. Cardiomediastinal contours are stable compared to prior imaging. Improved aeration at the LEFT lung base compared to prior imaging. Mild retrocardiac opacification. No visible pneumothorax.  The no sign of pleural effusion. On limited assessment no acute  skeletal process. IMPRESSION: 1. Improved aeration at the LEFT lung base compared to prior imaging. This may reflect resolving infection or volume loss with improvement since the prior study. Electronically Signed   By: Zetta Bills M.D.   On: 06/06/2020 14:04   DG Pelvis 1-2 Views  Result Date: 06/06/2020 CLINICAL DATA:  Pelvic  pain following multiple falls today. EXAM: PELVIS - 1-2 VIEW COMPARISON:  [facility] radiograph from 12/14/2019 small bowel series FINDINGS: There is some irregularity/apparent acute angulation of the left femoral cortex near the head neck junction although assessment is limited by the projection on this single image and the appearance could be secondary to obliquity with overlapping structures as well as femoral head spurring. The femoral heads are approximated with the acetabula. There is no pelvic diastasis. IMPRESSION: Cannot exclude a nondisplaced left femoral neck fracture. Recommend dedicated left hip radiographs for further evaluation. Electronically Signed   By: Logan Bores M.D.   On: 06/06/2020 14:06   DG Elbow Complete Left  Result Date: 06/06/2020 CLINICAL DATA:  Multiple falls.  Left elbow pain. EXAM: LEFT ELBOW - COMPLETE 3+ VIEW COMPARISON:  None. FINDINGS: The elbow joint is maintained. No significant degenerative changes or osteochondral abnormality. No acute fracture or joint effusion. IMPRESSION: No acute bony findings. Electronically Signed   By: Marijo Sanes M.D.   On: 06/06/2020 14:02   CT Head Wo Contrast  Result Date: 06/06/2020 CLINICAL DATA:  Multiple falls. EXAM: CT HEAD WITHOUT CONTRAST CT CERVICAL SPINE WITHOUT CONTRAST TECHNIQUE: Multidetector CT imaging of the head and cervical spine was performed following the standard protocol without intravenous contrast. Multiplanar CT image reconstructions of the cervical spine were also generated. COMPARISON:  May 26, 2020. FINDINGS: CT HEAD FINDINGS Brain: There is no evidence of acute hemorrhage, acute  infarction or mass lesion. There does appear to be increased bilateral subdural hygromas particularly over the superior portion of both cerebral cortices, right greater than left. Ventricular size is within normal limits. Vascular: No hyperdense vessel or unexpected calcification. Skull: Normal. Negative for fracture or focal lesion. Sinuses/Orbits: No acute finding. Other: None. CT CERVICAL SPINE FINDINGS Alignment: Normal. Skull base and vertebrae: No acute fracture. No primary bone lesion or focal pathologic process. Soft tissues and spinal canal: No prevertebral fluid or swelling. No visible canal hematoma. Disc levels: Moderate degenerative disc disease is noted at C4-5, C5-6 and C6-7. Upper chest: Negative. Other: None. IMPRESSION: Bilateral subdural hygromas are noted which are enlarged compared to prior exam, right greater than left. Mild diffuse cortical atrophy is noted. No other definite abnormality is noted. Moderate multilevel degenerative disc disease is noted. No acute abnormality seen in the cervical spine. Electronically Signed   By: Marijo Conception M.D.   On: 06/06/2020 13:42   CT Cervical Spine Wo Contrast  Result Date: 06/06/2020 CLINICAL DATA:  Multiple falls. EXAM: CT HEAD WITHOUT CONTRAST CT CERVICAL SPINE WITHOUT CONTRAST TECHNIQUE: Multidetector CT imaging of the head and cervical spine was performed following the standard protocol without intravenous contrast. Multiplanar CT image reconstructions of the cervical spine were also generated. COMPARISON:  May 26, 2020. FINDINGS: CT HEAD FINDINGS Brain: There is no evidence of acute hemorrhage, acute infarction or mass lesion. There does appear to be increased bilateral subdural hygromas particularly over the superior portion of both cerebral cortices, right greater than left. Ventricular size is within normal limits. Vascular: No hyperdense vessel or unexpected calcification. Skull: Normal. Negative for fracture or focal lesion.  Sinuses/Orbits: No acute finding. Other: None. CT CERVICAL SPINE FINDINGS Alignment: Normal. Skull base and vertebrae: No acute fracture. No primary bone lesion or focal pathologic process. Soft tissues and spinal canal: No prevertebral fluid or swelling. No visible canal hematoma. Disc levels: Moderate degenerative disc disease is noted at C4-5, C5-6 and C6-7. Upper chest: Negative. Other: None. IMPRESSION: Bilateral subdural hygromas are  noted which are enlarged compared to prior exam, right greater than left. Mild diffuse cortical atrophy is noted. No other definite abnormality is noted. Moderate multilevel degenerative disc disease is noted. No acute abnormality seen in the cervical spine. Electronically Signed   By: Marijo Conception M.D.   On: 06/06/2020 13:42   DG Hip Unilat W or Wo Pelvis 2-3 Views Left  Result Date: 06/06/2020 CLINICAL DATA:  Multiple fall EXAM: DG HIP (WITH OR WITHOUT PELVIS) 2-3V LEFT COMPARISON:  06/06/2020 FINDINGS: SI joints are non widened. Pubic symphysis and rami appear intact. No definitive fracture or malalignment is seen. IMPRESSION: No definite acute osseous abnormality. Electronically Signed   By: Donavan Foil M.D.   On: 06/06/2020 15:46    Procedures Procedures   Medications Ordered in ED Medications - No data to display  ED Course  I have reviewed the triage vital signs and the nursing notes.  Pertinent labs & imaging results that were available during my care of the patient were reviewed by me and considered in my medical decision making (see chart for details).    MDM Rules/Calculators/A&P                          Hgb is much improved at 11.1.  Pt does not have any evidence of any acute injury.    Urine pending at shift change.  She needs to ambulate.  If she is able to ambulate, she can be d/c.  Return if worse.   Final Clinical Impression(s) / ED Diagnoses Final diagnoses:  Fall, initial encounter    Rx / DC Orders ED Discharge Orders     None       Isla Pence, MD 06/06/20 1558

## 2020-06-06 NOTE — ED Notes (Signed)
Provider at bedside

## 2020-06-06 NOTE — ED Triage Notes (Signed)
Sent from nursing home for multiple falls, baseline mentation.  Alert to place, person , situation.  Family requested pt be sent for evaluation.  Family reported low Hgb and want to make sure that's still stable

## 2020-06-06 NOTE — ED Notes (Signed)
Consult for IV team I have attempted twice for IV placement and unsuccessful. Daughter states they had to consult iv team on last admission.

## 2020-06-06 NOTE — Discharge Instructions (Addendum)
Take Keflex as prescribed.  Finish your prescription. Return to the emergency department if you develop fever, chills, nausea vomiting, back pain or worsening symptoms. Have a recheck with your family doctor within 2 to 4 days.

## 2020-06-06 NOTE — ED Provider Notes (Signed)
F/u UA, gait test return to SNF Physical Exam  BP (!) 125/57   Pulse 84   Temp 97.7 F (36.5 C) (Oral)   Resp 17   LMP  (LMP Unknown)   SpO2 95%   Physical Exam  ED Course/Procedures     Procedures  MDM  Delay in care due to delay in urinalysis collection.  Urinalysis does show signs of gross infection.  Greater than 50 white cells and many bacteria.  Patient however does not have leukocytosis, renal function is normal.  No fever and normal vital signs.  No tachycardia or hypotension.  Patient has ambulated at baseline.  Plan will be for return to SNF with Keflex for 1 week.  Return precautions reviewed.       Charlesetta Shanks, MD 06/06/20 2039

## 2020-06-06 NOTE — ED Notes (Signed)
Provider at bedside at this time

## 2020-06-06 NOTE — ED Notes (Signed)
Received verbal report from [place]. RN

## 2020-06-06 NOTE — ED Notes (Signed)
Son out of room wanting to know how much longer it will be advised would need to speak with the provider reference same

## 2020-06-07 DIAGNOSIS — R410 Disorientation, unspecified: Secondary | ICD-10-CM | POA: Diagnosis not present

## 2020-06-07 DIAGNOSIS — Z743 Need for continuous supervision: Secondary | ICD-10-CM | POA: Diagnosis not present

## 2020-06-07 DIAGNOSIS — R279 Unspecified lack of coordination: Secondary | ICD-10-CM | POA: Diagnosis not present

## 2020-06-07 DIAGNOSIS — I959 Hypotension, unspecified: Secondary | ICD-10-CM | POA: Diagnosis not present

## 2020-06-07 DIAGNOSIS — W19XXXA Unspecified fall, initial encounter: Secondary | ICD-10-CM | POA: Diagnosis not present

## 2020-06-08 DIAGNOSIS — W19XXXA Unspecified fall, initial encounter: Secondary | ICD-10-CM | POA: Diagnosis not present

## 2020-06-08 DIAGNOSIS — D509 Iron deficiency anemia, unspecified: Secondary | ICD-10-CM | POA: Diagnosis not present

## 2020-06-08 DIAGNOSIS — N390 Urinary tract infection, site not specified: Secondary | ICD-10-CM | POA: Diagnosis not present

## 2020-06-08 DIAGNOSIS — R296 Repeated falls: Secondary | ICD-10-CM | POA: Diagnosis not present

## 2020-06-09 DIAGNOSIS — S065X0D Traumatic subdural hemorrhage without loss of consciousness, subsequent encounter: Secondary | ICD-10-CM | POA: Diagnosis not present

## 2020-06-09 DIAGNOSIS — D631 Anemia in chronic kidney disease: Secondary | ICD-10-CM | POA: Diagnosis not present

## 2020-06-09 DIAGNOSIS — R1310 Dysphagia, unspecified: Secondary | ICD-10-CM | POA: Diagnosis not present

## 2020-06-09 DIAGNOSIS — N189 Chronic kidney disease, unspecified: Secondary | ICD-10-CM | POA: Diagnosis not present

## 2020-06-09 DIAGNOSIS — F0280 Dementia in other diseases classified elsewhere without behavioral disturbance: Secondary | ICD-10-CM | POA: Diagnosis not present

## 2020-06-09 DIAGNOSIS — G309 Alzheimer's disease, unspecified: Secondary | ICD-10-CM | POA: Diagnosis not present

## 2020-06-09 DIAGNOSIS — D509 Iron deficiency anemia, unspecified: Secondary | ICD-10-CM | POA: Diagnosis not present

## 2020-06-09 DIAGNOSIS — J90 Pleural effusion, not elsewhere classified: Secondary | ICD-10-CM | POA: Diagnosis not present

## 2020-06-09 DIAGNOSIS — I10 Essential (primary) hypertension: Secondary | ICD-10-CM | POA: Diagnosis not present

## 2020-06-13 DIAGNOSIS — Z79899 Other long term (current) drug therapy: Secondary | ICD-10-CM | POA: Diagnosis not present

## 2020-06-13 DIAGNOSIS — G301 Alzheimer's disease with late onset: Secondary | ICD-10-CM | POA: Diagnosis not present

## 2020-06-13 DIAGNOSIS — R1310 Dysphagia, unspecified: Secondary | ICD-10-CM | POA: Diagnosis not present

## 2020-06-13 DIAGNOSIS — F339 Major depressive disorder, recurrent, unspecified: Secondary | ICD-10-CM | POA: Diagnosis not present

## 2020-06-13 DIAGNOSIS — E7849 Other hyperlipidemia: Secondary | ICD-10-CM | POA: Diagnosis not present

## 2020-06-13 DIAGNOSIS — G4701 Insomnia due to medical condition: Secondary | ICD-10-CM | POA: Diagnosis not present

## 2020-06-13 DIAGNOSIS — D509 Iron deficiency anemia, unspecified: Secondary | ICD-10-CM | POA: Diagnosis not present

## 2020-06-13 DIAGNOSIS — D631 Anemia in chronic kidney disease: Secondary | ICD-10-CM | POA: Diagnosis not present

## 2020-06-13 DIAGNOSIS — G309 Alzheimer's disease, unspecified: Secondary | ICD-10-CM | POA: Diagnosis not present

## 2020-06-13 DIAGNOSIS — F419 Anxiety disorder, unspecified: Secondary | ICD-10-CM | POA: Diagnosis not present

## 2020-06-13 DIAGNOSIS — F0280 Dementia in other diseases classified elsewhere without behavioral disturbance: Secondary | ICD-10-CM | POA: Diagnosis not present

## 2020-06-13 DIAGNOSIS — I10 Essential (primary) hypertension: Secondary | ICD-10-CM | POA: Diagnosis not present

## 2020-06-13 DIAGNOSIS — N189 Chronic kidney disease, unspecified: Secondary | ICD-10-CM | POA: Diagnosis not present

## 2020-06-13 DIAGNOSIS — E119 Type 2 diabetes mellitus without complications: Secondary | ICD-10-CM | POA: Diagnosis not present

## 2020-06-13 DIAGNOSIS — J90 Pleural effusion, not elsewhere classified: Secondary | ICD-10-CM | POA: Diagnosis not present

## 2020-06-13 DIAGNOSIS — D518 Other vitamin B12 deficiency anemias: Secondary | ICD-10-CM | POA: Diagnosis not present

## 2020-06-13 DIAGNOSIS — S065X0D Traumatic subdural hemorrhage without loss of consciousness, subsequent encounter: Secondary | ICD-10-CM | POA: Diagnosis not present

## 2020-06-15 DIAGNOSIS — D509 Iron deficiency anemia, unspecified: Secondary | ICD-10-CM | POA: Diagnosis not present

## 2020-06-15 DIAGNOSIS — I10 Essential (primary) hypertension: Secondary | ICD-10-CM | POA: Diagnosis not present

## 2020-06-15 DIAGNOSIS — G309 Alzheimer's disease, unspecified: Secondary | ICD-10-CM | POA: Diagnosis not present

## 2020-06-15 DIAGNOSIS — F0280 Dementia in other diseases classified elsewhere without behavioral disturbance: Secondary | ICD-10-CM | POA: Diagnosis not present

## 2020-06-15 DIAGNOSIS — D631 Anemia in chronic kidney disease: Secondary | ICD-10-CM | POA: Diagnosis not present

## 2020-06-15 DIAGNOSIS — S065X0D Traumatic subdural hemorrhage without loss of consciousness, subsequent encounter: Secondary | ICD-10-CM | POA: Diagnosis not present

## 2020-06-15 DIAGNOSIS — R1310 Dysphagia, unspecified: Secondary | ICD-10-CM | POA: Diagnosis not present

## 2020-06-15 DIAGNOSIS — J90 Pleural effusion, not elsewhere classified: Secondary | ICD-10-CM | POA: Diagnosis not present

## 2020-06-15 DIAGNOSIS — N189 Chronic kidney disease, unspecified: Secondary | ICD-10-CM | POA: Diagnosis not present

## 2020-06-19 DIAGNOSIS — D509 Iron deficiency anemia, unspecified: Secondary | ICD-10-CM | POA: Diagnosis not present

## 2020-06-19 DIAGNOSIS — S065X0D Traumatic subdural hemorrhage without loss of consciousness, subsequent encounter: Secondary | ICD-10-CM | POA: Diagnosis not present

## 2020-06-19 DIAGNOSIS — I10 Essential (primary) hypertension: Secondary | ICD-10-CM | POA: Diagnosis not present

## 2020-06-19 DIAGNOSIS — N189 Chronic kidney disease, unspecified: Secondary | ICD-10-CM | POA: Diagnosis not present

## 2020-06-19 DIAGNOSIS — D631 Anemia in chronic kidney disease: Secondary | ICD-10-CM | POA: Diagnosis not present

## 2020-06-19 DIAGNOSIS — F0280 Dementia in other diseases classified elsewhere without behavioral disturbance: Secondary | ICD-10-CM | POA: Diagnosis not present

## 2020-06-19 DIAGNOSIS — J90 Pleural effusion, not elsewhere classified: Secondary | ICD-10-CM | POA: Diagnosis not present

## 2020-06-19 DIAGNOSIS — G309 Alzheimer's disease, unspecified: Secondary | ICD-10-CM | POA: Diagnosis not present

## 2020-06-19 DIAGNOSIS — R1310 Dysphagia, unspecified: Secondary | ICD-10-CM | POA: Diagnosis not present

## 2020-06-20 DIAGNOSIS — F0280 Dementia in other diseases classified elsewhere without behavioral disturbance: Secondary | ICD-10-CM | POA: Diagnosis not present

## 2020-06-20 DIAGNOSIS — D631 Anemia in chronic kidney disease: Secondary | ICD-10-CM | POA: Diagnosis not present

## 2020-06-20 DIAGNOSIS — S065X0D Traumatic subdural hemorrhage without loss of consciousness, subsequent encounter: Secondary | ICD-10-CM | POA: Diagnosis not present

## 2020-06-20 DIAGNOSIS — I10 Essential (primary) hypertension: Secondary | ICD-10-CM | POA: Diagnosis not present

## 2020-06-20 DIAGNOSIS — J90 Pleural effusion, not elsewhere classified: Secondary | ICD-10-CM | POA: Diagnosis not present

## 2020-06-20 DIAGNOSIS — G309 Alzheimer's disease, unspecified: Secondary | ICD-10-CM | POA: Diagnosis not present

## 2020-06-20 DIAGNOSIS — D509 Iron deficiency anemia, unspecified: Secondary | ICD-10-CM | POA: Diagnosis not present

## 2020-06-20 DIAGNOSIS — R1310 Dysphagia, unspecified: Secondary | ICD-10-CM | POA: Diagnosis not present

## 2020-06-20 DIAGNOSIS — N189 Chronic kidney disease, unspecified: Secondary | ICD-10-CM | POA: Diagnosis not present

## 2020-06-22 DIAGNOSIS — R1310 Dysphagia, unspecified: Secondary | ICD-10-CM | POA: Diagnosis not present

## 2020-06-22 DIAGNOSIS — F0280 Dementia in other diseases classified elsewhere without behavioral disturbance: Secondary | ICD-10-CM | POA: Diagnosis not present

## 2020-06-22 DIAGNOSIS — N189 Chronic kidney disease, unspecified: Secondary | ICD-10-CM | POA: Diagnosis not present

## 2020-06-22 DIAGNOSIS — Z4802 Encounter for removal of sutures: Secondary | ICD-10-CM | POA: Diagnosis not present

## 2020-06-22 DIAGNOSIS — I10 Essential (primary) hypertension: Secondary | ICD-10-CM | POA: Diagnosis not present

## 2020-06-22 DIAGNOSIS — D631 Anemia in chronic kidney disease: Secondary | ICD-10-CM | POA: Diagnosis not present

## 2020-06-22 DIAGNOSIS — J90 Pleural effusion, not elsewhere classified: Secondary | ICD-10-CM | POA: Diagnosis not present

## 2020-06-22 DIAGNOSIS — S065X0D Traumatic subdural hemorrhage without loss of consciousness, subsequent encounter: Secondary | ICD-10-CM | POA: Diagnosis not present

## 2020-06-22 DIAGNOSIS — D509 Iron deficiency anemia, unspecified: Secondary | ICD-10-CM | POA: Diagnosis not present

## 2020-06-22 DIAGNOSIS — G309 Alzheimer's disease, unspecified: Secondary | ICD-10-CM | POA: Diagnosis not present

## 2020-06-26 DIAGNOSIS — N189 Chronic kidney disease, unspecified: Secondary | ICD-10-CM | POA: Diagnosis not present

## 2020-06-26 DIAGNOSIS — D509 Iron deficiency anemia, unspecified: Secondary | ICD-10-CM | POA: Diagnosis not present

## 2020-06-26 DIAGNOSIS — J90 Pleural effusion, not elsewhere classified: Secondary | ICD-10-CM | POA: Diagnosis not present

## 2020-06-26 DIAGNOSIS — S065X0D Traumatic subdural hemorrhage without loss of consciousness, subsequent encounter: Secondary | ICD-10-CM | POA: Diagnosis not present

## 2020-06-26 DIAGNOSIS — I10 Essential (primary) hypertension: Secondary | ICD-10-CM | POA: Diagnosis not present

## 2020-06-26 DIAGNOSIS — D631 Anemia in chronic kidney disease: Secondary | ICD-10-CM | POA: Diagnosis not present

## 2020-06-26 DIAGNOSIS — F0280 Dementia in other diseases classified elsewhere without behavioral disturbance: Secondary | ICD-10-CM | POA: Diagnosis not present

## 2020-06-26 DIAGNOSIS — G309 Alzheimer's disease, unspecified: Secondary | ICD-10-CM | POA: Diagnosis not present

## 2020-06-26 DIAGNOSIS — R1310 Dysphagia, unspecified: Secondary | ICD-10-CM | POA: Diagnosis not present

## 2020-06-30 DIAGNOSIS — S065X0D Traumatic subdural hemorrhage without loss of consciousness, subsequent encounter: Secondary | ICD-10-CM | POA: Diagnosis not present

## 2020-06-30 DIAGNOSIS — N189 Chronic kidney disease, unspecified: Secondary | ICD-10-CM | POA: Diagnosis not present

## 2020-06-30 DIAGNOSIS — G309 Alzheimer's disease, unspecified: Secondary | ICD-10-CM | POA: Diagnosis not present

## 2020-06-30 DIAGNOSIS — D509 Iron deficiency anemia, unspecified: Secondary | ICD-10-CM | POA: Diagnosis not present

## 2020-06-30 DIAGNOSIS — J90 Pleural effusion, not elsewhere classified: Secondary | ICD-10-CM | POA: Diagnosis not present

## 2020-06-30 DIAGNOSIS — R1310 Dysphagia, unspecified: Secondary | ICD-10-CM | POA: Diagnosis not present

## 2020-06-30 DIAGNOSIS — F0280 Dementia in other diseases classified elsewhere without behavioral disturbance: Secondary | ICD-10-CM | POA: Diagnosis not present

## 2020-06-30 DIAGNOSIS — D631 Anemia in chronic kidney disease: Secondary | ICD-10-CM | POA: Diagnosis not present

## 2020-06-30 DIAGNOSIS — I10 Essential (primary) hypertension: Secondary | ICD-10-CM | POA: Diagnosis not present

## 2020-07-06 DIAGNOSIS — G309 Alzheimer's disease, unspecified: Secondary | ICD-10-CM | POA: Diagnosis not present

## 2020-07-06 DIAGNOSIS — D509 Iron deficiency anemia, unspecified: Secondary | ICD-10-CM | POA: Diagnosis not present

## 2020-07-06 DIAGNOSIS — R1310 Dysphagia, unspecified: Secondary | ICD-10-CM | POA: Diagnosis not present

## 2020-07-06 DIAGNOSIS — S065X0D Traumatic subdural hemorrhage without loss of consciousness, subsequent encounter: Secondary | ICD-10-CM | POA: Diagnosis not present

## 2020-07-06 DIAGNOSIS — N189 Chronic kidney disease, unspecified: Secondary | ICD-10-CM | POA: Diagnosis not present

## 2020-07-06 DIAGNOSIS — D631 Anemia in chronic kidney disease: Secondary | ICD-10-CM | POA: Diagnosis not present

## 2020-07-06 DIAGNOSIS — F0280 Dementia in other diseases classified elsewhere without behavioral disturbance: Secondary | ICD-10-CM | POA: Diagnosis not present

## 2020-07-06 DIAGNOSIS — I10 Essential (primary) hypertension: Secondary | ICD-10-CM | POA: Diagnosis not present

## 2020-07-06 DIAGNOSIS — J90 Pleural effusion, not elsewhere classified: Secondary | ICD-10-CM | POA: Diagnosis not present

## 2020-07-07 DIAGNOSIS — J90 Pleural effusion, not elsewhere classified: Secondary | ICD-10-CM | POA: Diagnosis not present

## 2020-07-07 DIAGNOSIS — S065X0D Traumatic subdural hemorrhage without loss of consciousness, subsequent encounter: Secondary | ICD-10-CM | POA: Diagnosis not present

## 2020-07-07 DIAGNOSIS — G309 Alzheimer's disease, unspecified: Secondary | ICD-10-CM | POA: Diagnosis not present

## 2020-07-07 DIAGNOSIS — D631 Anemia in chronic kidney disease: Secondary | ICD-10-CM | POA: Diagnosis not present

## 2020-07-07 DIAGNOSIS — R1310 Dysphagia, unspecified: Secondary | ICD-10-CM | POA: Diagnosis not present

## 2020-07-07 DIAGNOSIS — I10 Essential (primary) hypertension: Secondary | ICD-10-CM | POA: Diagnosis not present

## 2020-07-07 DIAGNOSIS — N189 Chronic kidney disease, unspecified: Secondary | ICD-10-CM | POA: Diagnosis not present

## 2020-07-07 DIAGNOSIS — D509 Iron deficiency anemia, unspecified: Secondary | ICD-10-CM | POA: Diagnosis not present

## 2020-07-07 DIAGNOSIS — F0280 Dementia in other diseases classified elsewhere without behavioral disturbance: Secondary | ICD-10-CM | POA: Diagnosis not present

## 2020-07-08 DIAGNOSIS — F0280 Dementia in other diseases classified elsewhere without behavioral disturbance: Secondary | ICD-10-CM | POA: Diagnosis not present

## 2020-07-08 DIAGNOSIS — R1310 Dysphagia, unspecified: Secondary | ICD-10-CM | POA: Diagnosis not present

## 2020-07-08 DIAGNOSIS — G309 Alzheimer's disease, unspecified: Secondary | ICD-10-CM | POA: Diagnosis not present

## 2020-07-08 DIAGNOSIS — D509 Iron deficiency anemia, unspecified: Secondary | ICD-10-CM | POA: Diagnosis not present

## 2020-07-11 DIAGNOSIS — J90 Pleural effusion, not elsewhere classified: Secondary | ICD-10-CM | POA: Diagnosis not present

## 2020-07-11 DIAGNOSIS — G309 Alzheimer's disease, unspecified: Secondary | ICD-10-CM | POA: Diagnosis not present

## 2020-07-11 DIAGNOSIS — D631 Anemia in chronic kidney disease: Secondary | ICD-10-CM | POA: Diagnosis not present

## 2020-07-11 DIAGNOSIS — F0280 Dementia in other diseases classified elsewhere without behavioral disturbance: Secondary | ICD-10-CM | POA: Diagnosis not present

## 2020-07-11 DIAGNOSIS — I10 Essential (primary) hypertension: Secondary | ICD-10-CM | POA: Diagnosis not present

## 2020-07-11 DIAGNOSIS — D509 Iron deficiency anemia, unspecified: Secondary | ICD-10-CM | POA: Diagnosis not present

## 2020-07-11 DIAGNOSIS — R1310 Dysphagia, unspecified: Secondary | ICD-10-CM | POA: Diagnosis not present

## 2020-07-11 DIAGNOSIS — N189 Chronic kidney disease, unspecified: Secondary | ICD-10-CM | POA: Diagnosis not present

## 2020-07-11 DIAGNOSIS — S065X0D Traumatic subdural hemorrhage without loss of consciousness, subsequent encounter: Secondary | ICD-10-CM | POA: Diagnosis not present

## 2020-07-13 DIAGNOSIS — F0280 Dementia in other diseases classified elsewhere without behavioral disturbance: Secondary | ICD-10-CM | POA: Diagnosis not present

## 2020-07-13 DIAGNOSIS — R3981 Functional urinary incontinence: Secondary | ICD-10-CM | POA: Diagnosis not present

## 2020-07-13 DIAGNOSIS — I10 Essential (primary) hypertension: Secondary | ICD-10-CM | POA: Diagnosis not present

## 2020-07-13 DIAGNOSIS — R1310 Dysphagia, unspecified: Secondary | ICD-10-CM | POA: Diagnosis not present

## 2020-07-13 DIAGNOSIS — G309 Alzheimer's disease, unspecified: Secondary | ICD-10-CM | POA: Diagnosis not present

## 2020-07-13 DIAGNOSIS — N189 Chronic kidney disease, unspecified: Secondary | ICD-10-CM | POA: Diagnosis not present

## 2020-07-13 DIAGNOSIS — S065X0D Traumatic subdural hemorrhage without loss of consciousness, subsequent encounter: Secondary | ICD-10-CM | POA: Diagnosis not present

## 2020-07-13 DIAGNOSIS — G4701 Insomnia due to medical condition: Secondary | ICD-10-CM | POA: Diagnosis not present

## 2020-07-13 DIAGNOSIS — F419 Anxiety disorder, unspecified: Secondary | ICD-10-CM | POA: Diagnosis not present

## 2020-07-13 DIAGNOSIS — D631 Anemia in chronic kidney disease: Secondary | ICD-10-CM | POA: Diagnosis not present

## 2020-07-13 DIAGNOSIS — F339 Major depressive disorder, recurrent, unspecified: Secondary | ICD-10-CM | POA: Diagnosis not present

## 2020-07-13 DIAGNOSIS — D509 Iron deficiency anemia, unspecified: Secondary | ICD-10-CM | POA: Diagnosis not present

## 2020-07-13 DIAGNOSIS — G301 Alzheimer's disease with late onset: Secondary | ICD-10-CM | POA: Diagnosis not present

## 2020-07-13 DIAGNOSIS — R350 Frequency of micturition: Secondary | ICD-10-CM | POA: Diagnosis not present

## 2020-07-13 DIAGNOSIS — F0390 Unspecified dementia without behavioral disturbance: Secondary | ICD-10-CM | POA: Diagnosis not present

## 2020-07-13 DIAGNOSIS — J90 Pleural effusion, not elsewhere classified: Secondary | ICD-10-CM | POA: Diagnosis not present

## 2020-07-18 DIAGNOSIS — D631 Anemia in chronic kidney disease: Secondary | ICD-10-CM | POA: Diagnosis not present

## 2020-07-18 DIAGNOSIS — I10 Essential (primary) hypertension: Secondary | ICD-10-CM | POA: Diagnosis not present

## 2020-07-18 DIAGNOSIS — D509 Iron deficiency anemia, unspecified: Secondary | ICD-10-CM | POA: Diagnosis not present

## 2020-07-18 DIAGNOSIS — N189 Chronic kidney disease, unspecified: Secondary | ICD-10-CM | POA: Diagnosis not present

## 2020-07-18 DIAGNOSIS — R1310 Dysphagia, unspecified: Secondary | ICD-10-CM | POA: Diagnosis not present

## 2020-07-18 DIAGNOSIS — J90 Pleural effusion, not elsewhere classified: Secondary | ICD-10-CM | POA: Diagnosis not present

## 2020-07-18 DIAGNOSIS — S065X0D Traumatic subdural hemorrhage without loss of consciousness, subsequent encounter: Secondary | ICD-10-CM | POA: Diagnosis not present

## 2020-07-18 DIAGNOSIS — F0280 Dementia in other diseases classified elsewhere without behavioral disturbance: Secondary | ICD-10-CM | POA: Diagnosis not present

## 2020-07-18 DIAGNOSIS — G309 Alzheimer's disease, unspecified: Secondary | ICD-10-CM | POA: Diagnosis not present

## 2020-07-19 DIAGNOSIS — R8279 Other abnormal findings on microbiological examination of urine: Secondary | ICD-10-CM | POA: Diagnosis not present

## 2020-07-21 DIAGNOSIS — Z79899 Other long term (current) drug therapy: Secondary | ICD-10-CM | POA: Diagnosis not present

## 2020-07-25 DIAGNOSIS — I10 Essential (primary) hypertension: Secondary | ICD-10-CM | POA: Diagnosis not present

## 2020-07-25 DIAGNOSIS — G309 Alzheimer's disease, unspecified: Secondary | ICD-10-CM | POA: Diagnosis not present

## 2020-07-25 DIAGNOSIS — F0280 Dementia in other diseases classified elsewhere without behavioral disturbance: Secondary | ICD-10-CM | POA: Diagnosis not present

## 2020-07-25 DIAGNOSIS — D509 Iron deficiency anemia, unspecified: Secondary | ICD-10-CM | POA: Diagnosis not present

## 2020-07-25 DIAGNOSIS — D631 Anemia in chronic kidney disease: Secondary | ICD-10-CM | POA: Diagnosis not present

## 2020-07-25 DIAGNOSIS — J90 Pleural effusion, not elsewhere classified: Secondary | ICD-10-CM | POA: Diagnosis not present

## 2020-07-25 DIAGNOSIS — R1310 Dysphagia, unspecified: Secondary | ICD-10-CM | POA: Diagnosis not present

## 2020-07-25 DIAGNOSIS — S065X0D Traumatic subdural hemorrhage without loss of consciousness, subsequent encounter: Secondary | ICD-10-CM | POA: Diagnosis not present

## 2020-07-25 DIAGNOSIS — N189 Chronic kidney disease, unspecified: Secondary | ICD-10-CM | POA: Diagnosis not present

## 2020-07-27 DIAGNOSIS — I10 Essential (primary) hypertension: Secondary | ICD-10-CM | POA: Diagnosis not present

## 2020-07-27 DIAGNOSIS — J90 Pleural effusion, not elsewhere classified: Secondary | ICD-10-CM | POA: Diagnosis not present

## 2020-07-27 DIAGNOSIS — S065X0D Traumatic subdural hemorrhage without loss of consciousness, subsequent encounter: Secondary | ICD-10-CM | POA: Diagnosis not present

## 2020-07-27 DIAGNOSIS — D631 Anemia in chronic kidney disease: Secondary | ICD-10-CM | POA: Diagnosis not present

## 2020-07-27 DIAGNOSIS — D509 Iron deficiency anemia, unspecified: Secondary | ICD-10-CM | POA: Diagnosis not present

## 2020-07-27 DIAGNOSIS — N189 Chronic kidney disease, unspecified: Secondary | ICD-10-CM | POA: Diagnosis not present

## 2020-07-27 DIAGNOSIS — G309 Alzheimer's disease, unspecified: Secondary | ICD-10-CM | POA: Diagnosis not present

## 2020-07-27 DIAGNOSIS — F0280 Dementia in other diseases classified elsewhere without behavioral disturbance: Secondary | ICD-10-CM | POA: Diagnosis not present

## 2020-07-27 DIAGNOSIS — R1310 Dysphagia, unspecified: Secondary | ICD-10-CM | POA: Diagnosis not present

## 2020-07-31 DIAGNOSIS — R1310 Dysphagia, unspecified: Secondary | ICD-10-CM | POA: Diagnosis not present

## 2020-07-31 DIAGNOSIS — N189 Chronic kidney disease, unspecified: Secondary | ICD-10-CM | POA: Diagnosis not present

## 2020-07-31 DIAGNOSIS — D509 Iron deficiency anemia, unspecified: Secondary | ICD-10-CM | POA: Diagnosis not present

## 2020-07-31 DIAGNOSIS — D631 Anemia in chronic kidney disease: Secondary | ICD-10-CM | POA: Diagnosis not present

## 2020-07-31 DIAGNOSIS — G309 Alzheimer's disease, unspecified: Secondary | ICD-10-CM | POA: Diagnosis not present

## 2020-07-31 DIAGNOSIS — S065X0D Traumatic subdural hemorrhage without loss of consciousness, subsequent encounter: Secondary | ICD-10-CM | POA: Diagnosis not present

## 2020-07-31 DIAGNOSIS — J90 Pleural effusion, not elsewhere classified: Secondary | ICD-10-CM | POA: Diagnosis not present

## 2020-07-31 DIAGNOSIS — I10 Essential (primary) hypertension: Secondary | ICD-10-CM | POA: Diagnosis not present

## 2020-07-31 DIAGNOSIS — F0280 Dementia in other diseases classified elsewhere without behavioral disturbance: Secondary | ICD-10-CM | POA: Diagnosis not present

## 2020-08-02 DIAGNOSIS — N390 Urinary tract infection, site not specified: Secondary | ICD-10-CM | POA: Diagnosis not present

## 2020-08-02 DIAGNOSIS — I10 Essential (primary) hypertension: Secondary | ICD-10-CM | POA: Diagnosis not present

## 2020-08-02 DIAGNOSIS — D631 Anemia in chronic kidney disease: Secondary | ICD-10-CM | POA: Diagnosis not present

## 2020-08-02 DIAGNOSIS — J90 Pleural effusion, not elsewhere classified: Secondary | ICD-10-CM | POA: Diagnosis not present

## 2020-08-02 DIAGNOSIS — S065X0D Traumatic subdural hemorrhage without loss of consciousness, subsequent encounter: Secondary | ICD-10-CM | POA: Diagnosis not present

## 2020-08-02 DIAGNOSIS — F0280 Dementia in other diseases classified elsewhere without behavioral disturbance: Secondary | ICD-10-CM | POA: Diagnosis not present

## 2020-08-02 DIAGNOSIS — D509 Iron deficiency anemia, unspecified: Secondary | ICD-10-CM | POA: Diagnosis not present

## 2020-08-02 DIAGNOSIS — G309 Alzheimer's disease, unspecified: Secondary | ICD-10-CM | POA: Diagnosis not present

## 2020-08-02 DIAGNOSIS — N189 Chronic kidney disease, unspecified: Secondary | ICD-10-CM | POA: Diagnosis not present

## 2020-08-02 DIAGNOSIS — R1310 Dysphagia, unspecified: Secondary | ICD-10-CM | POA: Diagnosis not present

## 2020-08-03 DIAGNOSIS — F0390 Unspecified dementia without behavioral disturbance: Secondary | ICD-10-CM | POA: Diagnosis not present

## 2020-08-03 DIAGNOSIS — R2681 Unsteadiness on feet: Secondary | ICD-10-CM | POA: Diagnosis not present

## 2020-08-03 DIAGNOSIS — R8290 Unspecified abnormal findings in urine: Secondary | ICD-10-CM | POA: Diagnosis not present

## 2020-08-03 DIAGNOSIS — W19XXXA Unspecified fall, initial encounter: Secondary | ICD-10-CM | POA: Diagnosis not present

## 2020-08-28 DIAGNOSIS — H524 Presbyopia: Secondary | ICD-10-CM | POA: Diagnosis not present

## 2020-08-28 DIAGNOSIS — H25813 Combined forms of age-related cataract, bilateral: Secondary | ICD-10-CM | POA: Diagnosis not present

## 2020-09-06 DIAGNOSIS — F419 Anxiety disorder, unspecified: Secondary | ICD-10-CM | POA: Diagnosis not present

## 2020-09-06 DIAGNOSIS — F339 Major depressive disorder, recurrent, unspecified: Secondary | ICD-10-CM | POA: Diagnosis not present

## 2020-09-06 DIAGNOSIS — G301 Alzheimer's disease with late onset: Secondary | ICD-10-CM | POA: Diagnosis not present

## 2020-09-06 DIAGNOSIS — F0280 Dementia in other diseases classified elsewhere without behavioral disturbance: Secondary | ICD-10-CM | POA: Diagnosis not present

## 2020-09-06 DIAGNOSIS — G4701 Insomnia due to medical condition: Secondary | ICD-10-CM | POA: Diagnosis not present

## 2020-09-21 DIAGNOSIS — F330 Major depressive disorder, recurrent, mild: Secondary | ICD-10-CM | POA: Diagnosis not present

## 2020-09-21 DIAGNOSIS — E782 Mixed hyperlipidemia: Secondary | ICD-10-CM | POA: Diagnosis not present

## 2020-09-21 DIAGNOSIS — N3281 Overactive bladder: Secondary | ICD-10-CM | POA: Diagnosis not present

## 2020-09-21 DIAGNOSIS — R2681 Unsteadiness on feet: Secondary | ICD-10-CM | POA: Diagnosis not present

## 2020-09-21 DIAGNOSIS — G301 Alzheimer's disease with late onset: Secondary | ICD-10-CM | POA: Diagnosis not present

## 2020-09-21 DIAGNOSIS — N3946 Mixed incontinence: Secondary | ICD-10-CM | POA: Diagnosis not present

## 2020-09-21 DIAGNOSIS — Z8719 Personal history of other diseases of the digestive system: Secondary | ICD-10-CM | POA: Diagnosis not present

## 2020-09-21 DIAGNOSIS — F419 Anxiety disorder, unspecified: Secondary | ICD-10-CM | POA: Diagnosis not present

## 2020-09-21 DIAGNOSIS — K219 Gastro-esophageal reflux disease without esophagitis: Secondary | ICD-10-CM | POA: Diagnosis not present

## 2020-09-26 DIAGNOSIS — E119 Type 2 diabetes mellitus without complications: Secondary | ICD-10-CM | POA: Diagnosis not present

## 2020-09-26 DIAGNOSIS — E7849 Other hyperlipidemia: Secondary | ICD-10-CM | POA: Diagnosis not present

## 2020-09-26 DIAGNOSIS — D518 Other vitamin B12 deficiency anemias: Secondary | ICD-10-CM | POA: Diagnosis not present

## 2020-09-26 DIAGNOSIS — Z79899 Other long term (current) drug therapy: Secondary | ICD-10-CM | POA: Diagnosis not present

## 2020-10-04 DIAGNOSIS — G301 Alzheimer's disease with late onset: Secondary | ICD-10-CM | POA: Diagnosis not present

## 2020-10-04 DIAGNOSIS — G4701 Insomnia due to medical condition: Secondary | ICD-10-CM | POA: Diagnosis not present

## 2020-10-04 DIAGNOSIS — F339 Major depressive disorder, recurrent, unspecified: Secondary | ICD-10-CM | POA: Diagnosis not present

## 2020-10-04 DIAGNOSIS — F0280 Dementia in other diseases classified elsewhere without behavioral disturbance: Secondary | ICD-10-CM | POA: Diagnosis not present

## 2020-10-04 DIAGNOSIS — F419 Anxiety disorder, unspecified: Secondary | ICD-10-CM | POA: Diagnosis not present

## 2020-10-24 DIAGNOSIS — R5381 Other malaise: Secondary | ICD-10-CM | POA: Diagnosis not present

## 2020-10-24 DIAGNOSIS — M50321 Other cervical disc degeneration at C4-C5 level: Secondary | ICD-10-CM | POA: Diagnosis not present

## 2020-10-24 DIAGNOSIS — Z7401 Bed confinement status: Secondary | ICD-10-CM | POA: Diagnosis not present

## 2020-10-24 DIAGNOSIS — S065X9A Traumatic subdural hemorrhage with loss of consciousness of unspecified duration, initial encounter: Secondary | ICD-10-CM | POA: Diagnosis not present

## 2020-10-24 DIAGNOSIS — Z043 Encounter for examination and observation following other accident: Secondary | ICD-10-CM | POA: Diagnosis not present

## 2020-10-24 DIAGNOSIS — S065X0A Traumatic subdural hemorrhage without loss of consciousness, initial encounter: Secondary | ICD-10-CM | POA: Diagnosis not present

## 2020-10-24 DIAGNOSIS — G319 Degenerative disease of nervous system, unspecified: Secondary | ICD-10-CM | POA: Diagnosis not present

## 2020-10-24 DIAGNOSIS — R279 Unspecified lack of coordination: Secondary | ICD-10-CM | POA: Diagnosis not present

## 2020-10-24 DIAGNOSIS — R58 Hemorrhage, not elsewhere classified: Secondary | ICD-10-CM | POA: Diagnosis not present

## 2020-10-24 DIAGNOSIS — I6200 Nontraumatic subdural hemorrhage, unspecified: Secondary | ICD-10-CM | POA: Diagnosis not present

## 2020-10-24 DIAGNOSIS — M50323 Other cervical disc degeneration at C6-C7 level: Secondary | ICD-10-CM | POA: Diagnosis not present

## 2020-10-24 DIAGNOSIS — M50322 Other cervical disc degeneration at C5-C6 level: Secondary | ICD-10-CM | POA: Diagnosis not present

## 2020-11-07 DIAGNOSIS — F339 Major depressive disorder, recurrent, unspecified: Secondary | ICD-10-CM | POA: Diagnosis not present

## 2020-11-07 DIAGNOSIS — G4701 Insomnia due to medical condition: Secondary | ICD-10-CM | POA: Diagnosis not present

## 2020-11-07 DIAGNOSIS — G301 Alzheimer's disease with late onset: Secondary | ICD-10-CM | POA: Diagnosis not present

## 2020-11-07 DIAGNOSIS — F0280 Dementia in other diseases classified elsewhere without behavioral disturbance: Secondary | ICD-10-CM | POA: Diagnosis not present

## 2020-11-16 DIAGNOSIS — W57XXXA Bitten or stung by nonvenomous insect and other nonvenomous arthropods, initial encounter: Secondary | ICD-10-CM | POA: Diagnosis not present

## 2020-11-16 DIAGNOSIS — G301 Alzheimer's disease with late onset: Secondary | ICD-10-CM | POA: Diagnosis not present

## 2020-12-07 DIAGNOSIS — B351 Tinea unguium: Secondary | ICD-10-CM | POA: Diagnosis not present

## 2020-12-07 DIAGNOSIS — I7091 Generalized atherosclerosis: Secondary | ICD-10-CM | POA: Diagnosis not present

## 2020-12-14 DIAGNOSIS — G301 Alzheimer's disease with late onset: Secondary | ICD-10-CM | POA: Diagnosis not present

## 2020-12-14 DIAGNOSIS — G4701 Insomnia due to medical condition: Secondary | ICD-10-CM | POA: Diagnosis not present

## 2020-12-14 DIAGNOSIS — F339 Major depressive disorder, recurrent, unspecified: Secondary | ICD-10-CM | POA: Diagnosis not present

## 2020-12-14 DIAGNOSIS — F0280 Dementia in other diseases classified elsewhere without behavioral disturbance: Secondary | ICD-10-CM | POA: Diagnosis not present

## 2021-01-09 DIAGNOSIS — F339 Major depressive disorder, recurrent, unspecified: Secondary | ICD-10-CM | POA: Diagnosis not present

## 2021-01-09 DIAGNOSIS — G4701 Insomnia due to medical condition: Secondary | ICD-10-CM | POA: Diagnosis not present

## 2021-01-09 DIAGNOSIS — G301 Alzheimer's disease with late onset: Secondary | ICD-10-CM | POA: Diagnosis not present

## 2021-01-09 DIAGNOSIS — F0280 Dementia in other diseases classified elsewhere without behavioral disturbance: Secondary | ICD-10-CM | POA: Diagnosis not present

## 2021-02-02 DIAGNOSIS — F419 Anxiety disorder, unspecified: Secondary | ICD-10-CM | POA: Diagnosis not present

## 2021-02-02 DIAGNOSIS — F0280 Dementia in other diseases classified elsewhere without behavioral disturbance: Secondary | ICD-10-CM | POA: Diagnosis not present

## 2021-02-02 DIAGNOSIS — G301 Alzheimer's disease with late onset: Secondary | ICD-10-CM | POA: Diagnosis not present

## 2021-02-02 DIAGNOSIS — F339 Major depressive disorder, recurrent, unspecified: Secondary | ICD-10-CM | POA: Diagnosis not present

## 2021-02-02 DIAGNOSIS — G4701 Insomnia due to medical condition: Secondary | ICD-10-CM | POA: Diagnosis not present

## 2021-02-08 DIAGNOSIS — F419 Anxiety disorder, unspecified: Secondary | ICD-10-CM | POA: Diagnosis not present

## 2021-02-08 DIAGNOSIS — F330 Major depressive disorder, recurrent, mild: Secondary | ICD-10-CM | POA: Diagnosis not present

## 2021-02-08 DIAGNOSIS — G301 Alzheimer's disease with late onset: Secondary | ICD-10-CM | POA: Diagnosis not present

## 2021-02-08 DIAGNOSIS — I10 Essential (primary) hypertension: Secondary | ICD-10-CM | POA: Diagnosis not present

## 2021-02-08 DIAGNOSIS — N3281 Overactive bladder: Secondary | ICD-10-CM | POA: Diagnosis not present

## 2021-02-08 DIAGNOSIS — N1832 Chronic kidney disease, stage 3b: Secondary | ICD-10-CM | POA: Diagnosis not present

## 2021-02-08 DIAGNOSIS — K219 Gastro-esophageal reflux disease without esophagitis: Secondary | ICD-10-CM | POA: Diagnosis not present

## 2021-02-08 DIAGNOSIS — E782 Mixed hyperlipidemia: Secondary | ICD-10-CM | POA: Diagnosis not present

## 2021-02-08 DIAGNOSIS — D500 Iron deficiency anemia secondary to blood loss (chronic): Secondary | ICD-10-CM | POA: Diagnosis not present

## 2021-02-13 DIAGNOSIS — B351 Tinea unguium: Secondary | ICD-10-CM | POA: Diagnosis not present

## 2021-02-13 DIAGNOSIS — I7091 Generalized atherosclerosis: Secondary | ICD-10-CM | POA: Diagnosis not present

## 2021-02-14 DIAGNOSIS — D518 Other vitamin B12 deficiency anemias: Secondary | ICD-10-CM | POA: Diagnosis not present

## 2021-02-14 DIAGNOSIS — E7849 Other hyperlipidemia: Secondary | ICD-10-CM | POA: Diagnosis not present

## 2021-02-14 DIAGNOSIS — E119 Type 2 diabetes mellitus without complications: Secondary | ICD-10-CM | POA: Diagnosis not present

## 2021-02-14 DIAGNOSIS — Z79899 Other long term (current) drug therapy: Secondary | ICD-10-CM | POA: Diagnosis not present

## 2021-02-15 DIAGNOSIS — R279 Unspecified lack of coordination: Secondary | ICD-10-CM | POA: Diagnosis not present

## 2021-02-15 DIAGNOSIS — R0902 Hypoxemia: Secondary | ICD-10-CM | POA: Diagnosis not present

## 2021-02-15 DIAGNOSIS — K2970 Gastritis, unspecified, without bleeding: Secondary | ICD-10-CM | POA: Diagnosis not present

## 2021-02-15 DIAGNOSIS — R531 Weakness: Secondary | ICD-10-CM | POA: Diagnosis not present

## 2021-02-15 DIAGNOSIS — R5381 Other malaise: Secondary | ICD-10-CM | POA: Diagnosis not present

## 2021-02-15 DIAGNOSIS — K219 Gastro-esophageal reflux disease without esophagitis: Secondary | ICD-10-CM | POA: Diagnosis not present

## 2021-02-15 DIAGNOSIS — F0280 Dementia in other diseases classified elsewhere without behavioral disturbance: Secondary | ICD-10-CM | POA: Diagnosis not present

## 2021-02-15 DIAGNOSIS — F419 Anxiety disorder, unspecified: Secondary | ICD-10-CM | POA: Diagnosis not present

## 2021-02-15 DIAGNOSIS — G309 Alzheimer's disease, unspecified: Secondary | ICD-10-CM | POA: Diagnosis not present

## 2021-02-15 DIAGNOSIS — Z7401 Bed confinement status: Secondary | ICD-10-CM | POA: Diagnosis not present

## 2021-02-15 DIAGNOSIS — N183 Chronic kidney disease, stage 3 unspecified: Secondary | ICD-10-CM | POA: Diagnosis not present

## 2021-02-15 DIAGNOSIS — R195 Other fecal abnormalities: Secondary | ICD-10-CM | POA: Diagnosis not present

## 2021-02-15 DIAGNOSIS — Z882 Allergy status to sulfonamides status: Secondary | ICD-10-CM | POA: Diagnosis not present

## 2021-02-15 DIAGNOSIS — K922 Gastrointestinal hemorrhage, unspecified: Secondary | ICD-10-CM | POA: Diagnosis not present

## 2021-02-15 DIAGNOSIS — D62 Acute posthemorrhagic anemia: Secondary | ICD-10-CM | POA: Diagnosis not present

## 2021-02-15 DIAGNOSIS — K317 Polyp of stomach and duodenum: Secondary | ICD-10-CM | POA: Diagnosis not present

## 2021-02-15 DIAGNOSIS — E7800 Pure hypercholesterolemia, unspecified: Secondary | ICD-10-CM | POA: Diagnosis not present

## 2021-02-15 DIAGNOSIS — D131 Benign neoplasm of stomach: Secondary | ICD-10-CM | POA: Diagnosis not present

## 2021-02-15 DIAGNOSIS — D500 Iron deficiency anemia secondary to blood loss (chronic): Secondary | ICD-10-CM | POA: Diagnosis not present

## 2021-03-08 DIAGNOSIS — D509 Iron deficiency anemia, unspecified: Secondary | ICD-10-CM | POA: Diagnosis not present

## 2021-03-14 DIAGNOSIS — E119 Type 2 diabetes mellitus without complications: Secondary | ICD-10-CM | POA: Diagnosis not present

## 2021-03-14 DIAGNOSIS — Z79899 Other long term (current) drug therapy: Secondary | ICD-10-CM | POA: Diagnosis not present

## 2021-03-22 DIAGNOSIS — E7849 Other hyperlipidemia: Secondary | ICD-10-CM | POA: Diagnosis not present

## 2021-03-22 DIAGNOSIS — D518 Other vitamin B12 deficiency anemias: Secondary | ICD-10-CM | POA: Diagnosis not present

## 2021-03-22 DIAGNOSIS — E119 Type 2 diabetes mellitus without complications: Secondary | ICD-10-CM | POA: Diagnosis not present

## 2021-03-22 DIAGNOSIS — E559 Vitamin D deficiency, unspecified: Secondary | ICD-10-CM | POA: Diagnosis not present

## 2021-03-22 DIAGNOSIS — Z79899 Other long term (current) drug therapy: Secondary | ICD-10-CM | POA: Diagnosis not present

## 2021-03-22 DIAGNOSIS — E038 Other specified hypothyroidism: Secondary | ICD-10-CM | POA: Diagnosis not present

## 2021-04-05 DIAGNOSIS — Z79899 Other long term (current) drug therapy: Secondary | ICD-10-CM | POA: Diagnosis not present

## 2021-04-18 DIAGNOSIS — Z79899 Other long term (current) drug therapy: Secondary | ICD-10-CM | POA: Diagnosis not present

## 2021-04-20 DIAGNOSIS — F0390 Unspecified dementia without behavioral disturbance: Secondary | ICD-10-CM | POA: Diagnosis not present

## 2021-04-20 DIAGNOSIS — Z043 Encounter for examination and observation following other accident: Secondary | ICD-10-CM | POA: Diagnosis not present

## 2021-04-20 DIAGNOSIS — I10 Essential (primary) hypertension: Secondary | ICD-10-CM | POA: Diagnosis not present

## 2021-04-20 DIAGNOSIS — S0990XA Unspecified injury of head, initial encounter: Secondary | ICD-10-CM | POA: Diagnosis not present

## 2021-04-20 DIAGNOSIS — W19XXXA Unspecified fall, initial encounter: Secondary | ICD-10-CM | POA: Diagnosis not present

## 2021-04-30 DIAGNOSIS — Z79899 Other long term (current) drug therapy: Secondary | ICD-10-CM | POA: Diagnosis not present

## 2021-05-17 DIAGNOSIS — Z79899 Other long term (current) drug therapy: Secondary | ICD-10-CM | POA: Diagnosis not present

## 2021-05-30 DIAGNOSIS — Z79899 Other long term (current) drug therapy: Secondary | ICD-10-CM | POA: Diagnosis not present

## 2021-05-30 DIAGNOSIS — E038 Other specified hypothyroidism: Secondary | ICD-10-CM | POA: Diagnosis not present

## 2021-06-20 DIAGNOSIS — N390 Urinary tract infection, site not specified: Secondary | ICD-10-CM | POA: Diagnosis not present

## 2021-07-05 DIAGNOSIS — Z79899 Other long term (current) drug therapy: Secondary | ICD-10-CM | POA: Diagnosis not present

## 2021-07-17 DIAGNOSIS — Z79899 Other long term (current) drug therapy: Secondary | ICD-10-CM | POA: Diagnosis not present

## 2021-07-23 DIAGNOSIS — K449 Diaphragmatic hernia without obstruction or gangrene: Secondary | ICD-10-CM | POA: Diagnosis not present

## 2021-07-23 DIAGNOSIS — R509 Fever, unspecified: Secondary | ICD-10-CM | POA: Diagnosis not present

## 2021-07-23 DIAGNOSIS — R531 Weakness: Secondary | ICD-10-CM | POA: Diagnosis not present

## 2021-07-23 DIAGNOSIS — R0602 Shortness of breath: Secondary | ICD-10-CM | POA: Diagnosis not present

## 2021-07-23 DIAGNOSIS — G309 Alzheimer's disease, unspecified: Secondary | ICD-10-CM | POA: Diagnosis not present

## 2021-07-23 DIAGNOSIS — N390 Urinary tract infection, site not specified: Secondary | ICD-10-CM | POA: Diagnosis not present

## 2021-07-23 DIAGNOSIS — F0280 Dementia in other diseases classified elsewhere without behavioral disturbance: Secondary | ICD-10-CM | POA: Diagnosis not present

## 2021-07-23 DIAGNOSIS — Z20822 Contact with and (suspected) exposure to covid-19: Secondary | ICD-10-CM | POA: Diagnosis not present

## 2021-07-23 DIAGNOSIS — N183 Chronic kidney disease, stage 3 unspecified: Secondary | ICD-10-CM | POA: Diagnosis not present

## 2021-07-24 DIAGNOSIS — Z7401 Bed confinement status: Secondary | ICD-10-CM | POA: Diagnosis not present

## 2021-07-24 DIAGNOSIS — M2550 Pain in unspecified joint: Secondary | ICD-10-CM | POA: Diagnosis not present

## 2021-08-02 DIAGNOSIS — Z79899 Other long term (current) drug therapy: Secondary | ICD-10-CM | POA: Diagnosis not present

## 2021-08-02 DIAGNOSIS — D518 Other vitamin B12 deficiency anemias: Secondary | ICD-10-CM | POA: Diagnosis not present

## 2021-08-02 DIAGNOSIS — E119 Type 2 diabetes mellitus without complications: Secondary | ICD-10-CM | POA: Diagnosis not present

## 2021-08-02 DIAGNOSIS — E782 Mixed hyperlipidemia: Secondary | ICD-10-CM | POA: Diagnosis not present

## 2021-08-13 DIAGNOSIS — F419 Anxiety disorder, unspecified: Secondary | ICD-10-CM | POA: Diagnosis not present

## 2021-09-05 DIAGNOSIS — E559 Vitamin D deficiency, unspecified: Secondary | ICD-10-CM | POA: Diagnosis not present

## 2021-09-05 DIAGNOSIS — Z79899 Other long term (current) drug therapy: Secondary | ICD-10-CM | POA: Diagnosis not present

## 2021-09-07 DIAGNOSIS — N390 Urinary tract infection, site not specified: Secondary | ICD-10-CM | POA: Diagnosis not present

## 2021-09-18 DIAGNOSIS — E038 Other specified hypothyroidism: Secondary | ICD-10-CM | POA: Diagnosis not present

## 2021-09-18 DIAGNOSIS — E559 Vitamin D deficiency, unspecified: Secondary | ICD-10-CM | POA: Diagnosis not present

## 2021-09-18 DIAGNOSIS — Z79899 Other long term (current) drug therapy: Secondary | ICD-10-CM | POA: Diagnosis not present

## 2021-09-18 DIAGNOSIS — E782 Mixed hyperlipidemia: Secondary | ICD-10-CM | POA: Diagnosis not present

## 2021-09-21 DIAGNOSIS — G309 Alzheimer's disease, unspecified: Secondary | ICD-10-CM | POA: Diagnosis not present

## 2021-09-21 DIAGNOSIS — D631 Anemia in chronic kidney disease: Secondary | ICD-10-CM | POA: Diagnosis not present

## 2021-09-21 DIAGNOSIS — F0284 Dementia in other diseases classified elsewhere, unspecified severity, with anxiety: Secondary | ICD-10-CM | POA: Diagnosis not present

## 2021-09-21 DIAGNOSIS — N183 Chronic kidney disease, stage 3 unspecified: Secondary | ICD-10-CM | POA: Diagnosis not present

## 2021-10-02 DIAGNOSIS — Z79899 Other long term (current) drug therapy: Secondary | ICD-10-CM | POA: Diagnosis not present

## 2021-10-24 DIAGNOSIS — Z79899 Other long term (current) drug therapy: Secondary | ICD-10-CM | POA: Diagnosis not present

## 2021-11-01 DIAGNOSIS — I7091 Generalized atherosclerosis: Secondary | ICD-10-CM | POA: Diagnosis not present

## 2021-11-01 DIAGNOSIS — B351 Tinea unguium: Secondary | ICD-10-CM | POA: Diagnosis not present

## 2021-11-06 DIAGNOSIS — Z79899 Other long term (current) drug therapy: Secondary | ICD-10-CM | POA: Diagnosis not present

## 2021-11-20 DIAGNOSIS — Z79899 Other long term (current) drug therapy: Secondary | ICD-10-CM | POA: Diagnosis not present

## 2021-12-04 DIAGNOSIS — Z79899 Other long term (current) drug therapy: Secondary | ICD-10-CM | POA: Diagnosis not present

## 2021-12-18 DIAGNOSIS — Z79899 Other long term (current) drug therapy: Secondary | ICD-10-CM | POA: Diagnosis not present

## 2022-01-09 DIAGNOSIS — Z79899 Other long term (current) drug therapy: Secondary | ICD-10-CM | POA: Diagnosis not present

## 2022-01-15 DIAGNOSIS — I7091 Generalized atherosclerosis: Secondary | ICD-10-CM | POA: Diagnosis not present

## 2022-01-15 DIAGNOSIS — B351 Tinea unguium: Secondary | ICD-10-CM | POA: Diagnosis not present

## 2022-01-18 DIAGNOSIS — Z79899 Other long term (current) drug therapy: Secondary | ICD-10-CM | POA: Diagnosis not present

## 2022-01-29 DIAGNOSIS — E039 Hypothyroidism, unspecified: Secondary | ICD-10-CM | POA: Diagnosis not present

## 2022-01-29 DIAGNOSIS — R0989 Other specified symptoms and signs involving the circulatory and respiratory systems: Secondary | ICD-10-CM | POA: Diagnosis not present

## 2022-01-29 DIAGNOSIS — R1310 Dysphagia, unspecified: Secondary | ICD-10-CM | POA: Diagnosis not present

## 2022-01-29 DIAGNOSIS — Z79899 Other long term (current) drug therapy: Secondary | ICD-10-CM | POA: Diagnosis not present

## 2022-02-07 DIAGNOSIS — G4701 Insomnia due to medical condition: Secondary | ICD-10-CM | POA: Diagnosis not present

## 2022-02-07 DIAGNOSIS — F419 Anxiety disorder, unspecified: Secondary | ICD-10-CM | POA: Diagnosis not present

## 2022-02-07 DIAGNOSIS — G301 Alzheimer's disease with late onset: Secondary | ICD-10-CM | POA: Diagnosis not present

## 2022-02-07 DIAGNOSIS — F02B3 Dementia in other diseases classified elsewhere, moderate, with mood disturbance: Secondary | ICD-10-CM | POA: Diagnosis not present

## 2022-02-07 DIAGNOSIS — F331 Major depressive disorder, recurrent, moderate: Secondary | ICD-10-CM | POA: Diagnosis not present

## 2022-02-13 DIAGNOSIS — Z79899 Other long term (current) drug therapy: Secondary | ICD-10-CM | POA: Diagnosis not present

## 2022-02-19 DIAGNOSIS — R1310 Dysphagia, unspecified: Secondary | ICD-10-CM | POA: Diagnosis not present

## 2022-02-26 DIAGNOSIS — Z79899 Other long term (current) drug therapy: Secondary | ICD-10-CM | POA: Diagnosis not present

## 2022-03-07 DIAGNOSIS — G301 Alzheimer's disease with late onset: Secondary | ICD-10-CM | POA: Diagnosis not present

## 2022-03-07 DIAGNOSIS — F02B3 Dementia in other diseases classified elsewhere, moderate, with mood disturbance: Secondary | ICD-10-CM | POA: Diagnosis not present

## 2022-03-07 DIAGNOSIS — F331 Major depressive disorder, recurrent, moderate: Secondary | ICD-10-CM | POA: Diagnosis not present

## 2022-03-07 DIAGNOSIS — F419 Anxiety disorder, unspecified: Secondary | ICD-10-CM | POA: Diagnosis not present

## 2022-03-07 DIAGNOSIS — R1319 Other dysphagia: Secondary | ICD-10-CM | POA: Diagnosis not present

## 2022-03-07 DIAGNOSIS — G4701 Insomnia due to medical condition: Secondary | ICD-10-CM | POA: Diagnosis not present

## 2022-03-12 DIAGNOSIS — Z79899 Other long term (current) drug therapy: Secondary | ICD-10-CM | POA: Diagnosis not present

## 2022-03-14 DIAGNOSIS — E785 Hyperlipidemia, unspecified: Secondary | ICD-10-CM | POA: Diagnosis not present

## 2022-03-14 DIAGNOSIS — D509 Iron deficiency anemia, unspecified: Secondary | ICD-10-CM | POA: Diagnosis not present

## 2022-03-14 DIAGNOSIS — N183 Chronic kidney disease, stage 3 unspecified: Secondary | ICD-10-CM | POA: Diagnosis not present

## 2022-03-14 DIAGNOSIS — I129 Hypertensive chronic kidney disease with stage 1 through stage 4 chronic kidney disease, or unspecified chronic kidney disease: Secondary | ICD-10-CM | POA: Diagnosis not present

## 2022-03-14 DIAGNOSIS — E039 Hypothyroidism, unspecified: Secondary | ICD-10-CM | POA: Diagnosis not present

## 2022-03-14 DIAGNOSIS — F0390 Unspecified dementia without behavioral disturbance: Secondary | ICD-10-CM | POA: Diagnosis not present

## 2022-03-14 DIAGNOSIS — I5032 Chronic diastolic (congestive) heart failure: Secondary | ICD-10-CM | POA: Diagnosis not present

## 2022-03-26 DIAGNOSIS — E782 Mixed hyperlipidemia: Secondary | ICD-10-CM | POA: Diagnosis not present

## 2022-03-26 DIAGNOSIS — Z79899 Other long term (current) drug therapy: Secondary | ICD-10-CM | POA: Diagnosis not present

## 2022-03-26 DIAGNOSIS — D518 Other vitamin B12 deficiency anemias: Secondary | ICD-10-CM | POA: Diagnosis not present

## 2022-03-26 DIAGNOSIS — E038 Other specified hypothyroidism: Secondary | ICD-10-CM | POA: Diagnosis not present

## 2022-03-26 DIAGNOSIS — E119 Type 2 diabetes mellitus without complications: Secondary | ICD-10-CM | POA: Diagnosis not present

## 2022-04-04 DIAGNOSIS — G4701 Insomnia due to medical condition: Secondary | ICD-10-CM | POA: Diagnosis not present

## 2022-04-04 DIAGNOSIS — F419 Anxiety disorder, unspecified: Secondary | ICD-10-CM | POA: Diagnosis not present

## 2022-04-04 DIAGNOSIS — F02B3 Dementia in other diseases classified elsewhere, moderate, with mood disturbance: Secondary | ICD-10-CM | POA: Diagnosis not present

## 2022-04-04 DIAGNOSIS — G301 Alzheimer's disease with late onset: Secondary | ICD-10-CM | POA: Diagnosis not present

## 2022-04-04 DIAGNOSIS — F331 Major depressive disorder, recurrent, moderate: Secondary | ICD-10-CM | POA: Diagnosis not present

## 2022-04-09 DIAGNOSIS — I7091 Generalized atherosclerosis: Secondary | ICD-10-CM | POA: Diagnosis not present

## 2022-04-09 DIAGNOSIS — Z79899 Other long term (current) drug therapy: Secondary | ICD-10-CM | POA: Diagnosis not present

## 2022-04-09 DIAGNOSIS — B351 Tinea unguium: Secondary | ICD-10-CM | POA: Diagnosis not present

## 2022-04-18 DIAGNOSIS — Z9181 History of falling: Secondary | ICD-10-CM | POA: Diagnosis not present

## 2022-04-18 DIAGNOSIS — M6281 Muscle weakness (generalized): Secondary | ICD-10-CM

## 2022-04-18 DIAGNOSIS — G309 Alzheimer's disease, unspecified: Secondary | ICD-10-CM | POA: Diagnosis not present

## 2022-04-18 DIAGNOSIS — W19XXXA Unspecified fall, initial encounter: Secondary | ICD-10-CM

## 2022-04-18 DIAGNOSIS — R269 Unspecified abnormalities of gait and mobility: Secondary | ICD-10-CM

## 2022-04-18 DIAGNOSIS — S0083XA Contusion of other part of head, initial encounter: Secondary | ICD-10-CM

## 2022-04-18 DIAGNOSIS — R2689 Other abnormalities of gait and mobility: Secondary | ICD-10-CM

## 2022-04-23 DIAGNOSIS — Z79899 Other long term (current) drug therapy: Secondary | ICD-10-CM | POA: Diagnosis not present

## 2022-05-07 DIAGNOSIS — K219 Gastro-esophageal reflux disease without esophagitis: Secondary | ICD-10-CM | POA: Diagnosis not present

## 2022-05-07 DIAGNOSIS — R1310 Dysphagia, unspecified: Secondary | ICD-10-CM | POA: Diagnosis not present

## 2022-05-07 DIAGNOSIS — Z79899 Other long term (current) drug therapy: Secondary | ICD-10-CM | POA: Diagnosis not present

## 2022-05-09 DIAGNOSIS — F331 Major depressive disorder, recurrent, moderate: Secondary | ICD-10-CM | POA: Diagnosis not present

## 2022-05-09 DIAGNOSIS — F02B3 Dementia in other diseases classified elsewhere, moderate, with mood disturbance: Secondary | ICD-10-CM | POA: Diagnosis not present

## 2022-05-09 DIAGNOSIS — G301 Alzheimer's disease with late onset: Secondary | ICD-10-CM | POA: Diagnosis not present

## 2022-05-09 DIAGNOSIS — F419 Anxiety disorder, unspecified: Secondary | ICD-10-CM | POA: Diagnosis not present

## 2022-05-09 DIAGNOSIS — G4701 Insomnia due to medical condition: Secondary | ICD-10-CM | POA: Diagnosis not present

## 2022-05-21 DIAGNOSIS — Z79899 Other long term (current) drug therapy: Secondary | ICD-10-CM | POA: Diagnosis not present

## 2022-05-23 DIAGNOSIS — G309 Alzheimer's disease, unspecified: Secondary | ICD-10-CM | POA: Diagnosis not present

## 2022-05-23 DIAGNOSIS — T17920A Food in respiratory tract, part unspecified causing asphyxiation, initial encounter: Secondary | ICD-10-CM | POA: Diagnosis not present

## 2022-05-23 DIAGNOSIS — F419 Anxiety disorder, unspecified: Secondary | ICD-10-CM | POA: Diagnosis not present

## 2022-05-23 DIAGNOSIS — R1310 Dysphagia, unspecified: Secondary | ICD-10-CM | POA: Diagnosis not present

## 2022-05-25 DIAGNOSIS — N390 Urinary tract infection, site not specified: Secondary | ICD-10-CM | POA: Diagnosis not present

## 2022-05-30 DIAGNOSIS — N390 Urinary tract infection, site not specified: Secondary | ICD-10-CM | POA: Diagnosis not present

## 2022-05-30 DIAGNOSIS — R4182 Altered mental status, unspecified: Secondary | ICD-10-CM | POA: Diagnosis not present

## 2022-05-30 DIAGNOSIS — B961 Klebsiella pneumoniae [K. pneumoniae] as the cause of diseases classified elsewhere: Secondary | ICD-10-CM | POA: Diagnosis not present

## 2022-06-06 DIAGNOSIS — G4701 Insomnia due to medical condition: Secondary | ICD-10-CM | POA: Diagnosis not present

## 2022-06-06 DIAGNOSIS — F331 Major depressive disorder, recurrent, moderate: Secondary | ICD-10-CM | POA: Diagnosis not present

## 2022-06-06 DIAGNOSIS — F02B3 Dementia in other diseases classified elsewhere, moderate, with mood disturbance: Secondary | ICD-10-CM | POA: Diagnosis not present

## 2022-06-06 DIAGNOSIS — Z7689 Persons encountering health services in other specified circumstances: Secondary | ICD-10-CM | POA: Diagnosis not present

## 2022-06-06 DIAGNOSIS — G301 Alzheimer's disease with late onset: Secondary | ICD-10-CM | POA: Diagnosis not present

## 2022-06-06 DIAGNOSIS — R1310 Dysphagia, unspecified: Secondary | ICD-10-CM | POA: Diagnosis not present

## 2022-06-06 DIAGNOSIS — Z79899 Other long term (current) drug therapy: Secondary | ICD-10-CM | POA: Diagnosis not present

## 2022-06-06 DIAGNOSIS — F419 Anxiety disorder, unspecified: Secondary | ICD-10-CM | POA: Diagnosis not present

## 2022-06-06 DIAGNOSIS — G309 Alzheimer's disease, unspecified: Secondary | ICD-10-CM | POA: Diagnosis not present

## 2022-06-12 DIAGNOSIS — B351 Tinea unguium: Secondary | ICD-10-CM | POA: Diagnosis not present

## 2022-06-12 DIAGNOSIS — I7091 Generalized atherosclerosis: Secondary | ICD-10-CM | POA: Diagnosis not present

## 2022-06-18 DIAGNOSIS — Z79899 Other long term (current) drug therapy: Secondary | ICD-10-CM | POA: Diagnosis not present

## 2022-06-20 DIAGNOSIS — F0390 Unspecified dementia without behavioral disturbance: Secondary | ICD-10-CM | POA: Diagnosis not present

## 2022-06-20 DIAGNOSIS — L299 Pruritus, unspecified: Secondary | ICD-10-CM | POA: Diagnosis not present

## 2022-06-20 DIAGNOSIS — B029 Zoster without complications: Secondary | ICD-10-CM | POA: Diagnosis not present

## 2022-06-27 DIAGNOSIS — L299 Pruritus, unspecified: Secondary | ICD-10-CM | POA: Diagnosis not present

## 2022-06-27 DIAGNOSIS — G309 Alzheimer's disease, unspecified: Secondary | ICD-10-CM | POA: Diagnosis not present

## 2022-06-27 DIAGNOSIS — B029 Zoster without complications: Secondary | ICD-10-CM | POA: Diagnosis not present

## 2022-06-27 DIAGNOSIS — R21 Rash and other nonspecific skin eruption: Secondary | ICD-10-CM | POA: Diagnosis not present

## 2022-07-02 DIAGNOSIS — Z79899 Other long term (current) drug therapy: Secondary | ICD-10-CM | POA: Diagnosis not present

## 2022-07-02 DIAGNOSIS — R1310 Dysphagia, unspecified: Secondary | ICD-10-CM | POA: Diagnosis not present

## 2022-07-04 DIAGNOSIS — F331 Major depressive disorder, recurrent, moderate: Secondary | ICD-10-CM | POA: Diagnosis not present

## 2022-07-04 DIAGNOSIS — G301 Alzheimer's disease with late onset: Secondary | ICD-10-CM | POA: Diagnosis not present

## 2022-07-04 DIAGNOSIS — F419 Anxiety disorder, unspecified: Secondary | ICD-10-CM | POA: Diagnosis not present

## 2022-07-04 DIAGNOSIS — R1310 Dysphagia, unspecified: Secondary | ICD-10-CM | POA: Diagnosis not present

## 2022-07-04 DIAGNOSIS — G309 Alzheimer's disease, unspecified: Secondary | ICD-10-CM | POA: Diagnosis not present

## 2022-07-04 DIAGNOSIS — R059 Cough, unspecified: Secondary | ICD-10-CM | POA: Diagnosis not present

## 2022-07-04 DIAGNOSIS — F02B3 Dementia in other diseases classified elsewhere, moderate, with mood disturbance: Secondary | ICD-10-CM | POA: Diagnosis not present

## 2022-07-04 DIAGNOSIS — G4701 Insomnia due to medical condition: Secondary | ICD-10-CM | POA: Diagnosis not present

## 2022-07-11 DIAGNOSIS — G309 Alzheimer's disease, unspecified: Secondary | ICD-10-CM | POA: Diagnosis not present

## 2022-07-11 DIAGNOSIS — M21611 Bunion of right foot: Secondary | ICD-10-CM | POA: Diagnosis not present

## 2022-07-11 DIAGNOSIS — R1310 Dysphagia, unspecified: Secondary | ICD-10-CM | POA: Diagnosis not present

## 2022-07-16 DIAGNOSIS — Z79899 Other long term (current) drug therapy: Secondary | ICD-10-CM | POA: Diagnosis not present

## 2022-07-30 DIAGNOSIS — Z79899 Other long term (current) drug therapy: Secondary | ICD-10-CM | POA: Diagnosis not present

## 2022-08-01 DIAGNOSIS — G301 Alzheimer's disease with late onset: Secondary | ICD-10-CM | POA: Diagnosis not present

## 2022-08-01 DIAGNOSIS — F02B3 Dementia in other diseases classified elsewhere, moderate, with mood disturbance: Secondary | ICD-10-CM | POA: Diagnosis not present

## 2022-08-01 DIAGNOSIS — F331 Major depressive disorder, recurrent, moderate: Secondary | ICD-10-CM | POA: Diagnosis not present

## 2022-08-01 DIAGNOSIS — F419 Anxiety disorder, unspecified: Secondary | ICD-10-CM | POA: Diagnosis not present

## 2022-08-01 DIAGNOSIS — G4701 Insomnia due to medical condition: Secondary | ICD-10-CM | POA: Diagnosis not present

## 2022-08-05 DIAGNOSIS — I10 Essential (primary) hypertension: Secondary | ICD-10-CM | POA: Diagnosis not present

## 2022-08-05 DIAGNOSIS — Z043 Encounter for examination and observation following other accident: Secondary | ICD-10-CM | POA: Diagnosis not present

## 2022-08-05 DIAGNOSIS — Z743 Need for continuous supervision: Secondary | ICD-10-CM | POA: Diagnosis not present

## 2022-08-05 DIAGNOSIS — W19XXXA Unspecified fall, initial encounter: Secondary | ICD-10-CM | POA: Diagnosis not present

## 2022-08-05 DIAGNOSIS — R509 Fever, unspecified: Secondary | ICD-10-CM | POA: Diagnosis not present

## 2022-08-05 DIAGNOSIS — F03C0 Unspecified dementia, severe, without behavioral disturbance, psychotic disturbance, mood disturbance, and anxiety: Secondary | ICD-10-CM | POA: Diagnosis not present

## 2022-08-05 DIAGNOSIS — R531 Weakness: Secondary | ICD-10-CM | POA: Diagnosis not present

## 2022-08-05 DIAGNOSIS — K449 Diaphragmatic hernia without obstruction or gangrene: Secondary | ICD-10-CM | POA: Diagnosis not present

## 2022-08-05 DIAGNOSIS — N390 Urinary tract infection, site not specified: Secondary | ICD-10-CM | POA: Diagnosis not present

## 2022-08-06 ENCOUNTER — Ambulatory Visit: Payer: Medicare HMO | Admitting: Podiatry

## 2022-08-07 DIAGNOSIS — N3000 Acute cystitis without hematuria: Secondary | ICD-10-CM | POA: Diagnosis not present

## 2022-08-07 DIAGNOSIS — S0003XA Contusion of scalp, initial encounter: Secondary | ICD-10-CM | POA: Diagnosis not present

## 2022-08-07 DIAGNOSIS — R5383 Other fatigue: Secondary | ICD-10-CM | POA: Diagnosis not present

## 2022-08-07 DIAGNOSIS — S199XXA Unspecified injury of neck, initial encounter: Secondary | ICD-10-CM | POA: Diagnosis not present

## 2022-08-07 DIAGNOSIS — W19XXXA Unspecified fall, initial encounter: Secondary | ICD-10-CM | POA: Diagnosis not present

## 2022-08-08 DIAGNOSIS — D72829 Elevated white blood cell count, unspecified: Secondary | ICD-10-CM | POA: Diagnosis not present

## 2022-08-08 DIAGNOSIS — R531 Weakness: Secondary | ICD-10-CM | POA: Diagnosis not present

## 2022-08-08 DIAGNOSIS — R2689 Other abnormalities of gait and mobility: Secondary | ICD-10-CM | POA: Diagnosis not present

## 2022-08-08 DIAGNOSIS — R269 Unspecified abnormalities of gait and mobility: Secondary | ICD-10-CM | POA: Diagnosis not present

## 2022-08-08 DIAGNOSIS — N390 Urinary tract infection, site not specified: Secondary | ICD-10-CM | POA: Diagnosis not present

## 2022-08-08 DIAGNOSIS — E871 Hypo-osmolality and hyponatremia: Secondary | ICD-10-CM | POA: Diagnosis not present

## 2022-08-08 DIAGNOSIS — G309 Alzheimer's disease, unspecified: Secondary | ICD-10-CM | POA: Diagnosis not present

## 2022-08-08 DIAGNOSIS — M6281 Muscle weakness (generalized): Secondary | ICD-10-CM | POA: Diagnosis not present

## 2022-08-08 DIAGNOSIS — W19XXXA Unspecified fall, initial encounter: Secondary | ICD-10-CM | POA: Diagnosis not present

## 2022-08-13 DIAGNOSIS — E782 Mixed hyperlipidemia: Secondary | ICD-10-CM | POA: Diagnosis not present

## 2022-08-13 DIAGNOSIS — D518 Other vitamin B12 deficiency anemias: Secondary | ICD-10-CM | POA: Diagnosis not present

## 2022-08-13 DIAGNOSIS — Z79899 Other long term (current) drug therapy: Secondary | ICD-10-CM | POA: Diagnosis not present

## 2022-08-13 DIAGNOSIS — E119 Type 2 diabetes mellitus without complications: Secondary | ICD-10-CM | POA: Diagnosis not present

## 2022-08-15 DIAGNOSIS — F419 Anxiety disorder, unspecified: Secondary | ICD-10-CM | POA: Diagnosis not present

## 2022-08-15 DIAGNOSIS — F331 Major depressive disorder, recurrent, moderate: Secondary | ICD-10-CM | POA: Diagnosis not present

## 2022-08-15 DIAGNOSIS — F5101 Primary insomnia: Secondary | ICD-10-CM | POA: Diagnosis not present

## 2022-08-15 DIAGNOSIS — Z9181 History of falling: Secondary | ICD-10-CM | POA: Diagnosis not present

## 2022-08-15 DIAGNOSIS — N189 Chronic kidney disease, unspecified: Secondary | ICD-10-CM | POA: Diagnosis not present

## 2022-08-15 DIAGNOSIS — N390 Urinary tract infection, site not specified: Secondary | ICD-10-CM | POA: Diagnosis not present

## 2022-08-15 DIAGNOSIS — K219 Gastro-esophageal reflux disease without esophagitis: Secondary | ICD-10-CM | POA: Diagnosis not present

## 2022-08-15 DIAGNOSIS — F0283 Dementia in other diseases classified elsewhere, unspecified severity, with mood disturbance: Secondary | ICD-10-CM | POA: Diagnosis not present

## 2022-08-15 DIAGNOSIS — G301 Alzheimer's disease with late onset: Secondary | ICD-10-CM | POA: Diagnosis not present

## 2022-08-15 DIAGNOSIS — F0284 Dementia in other diseases classified elsewhere, unspecified severity, with anxiety: Secondary | ICD-10-CM | POA: Diagnosis not present

## 2022-08-20 ENCOUNTER — Ambulatory Visit: Payer: Medicare HMO

## 2022-08-20 ENCOUNTER — Ambulatory Visit (INDEPENDENT_AMBULATORY_CARE_PROVIDER_SITE_OTHER): Payer: Medicare HMO | Admitting: Podiatry

## 2022-08-20 DIAGNOSIS — M79675 Pain in left toe(s): Secondary | ICD-10-CM | POA: Diagnosis not present

## 2022-08-20 DIAGNOSIS — M21619 Bunion of unspecified foot: Secondary | ICD-10-CM

## 2022-08-20 DIAGNOSIS — M79674 Pain in right toe(s): Secondary | ICD-10-CM | POA: Diagnosis not present

## 2022-08-20 DIAGNOSIS — L84 Corns and callosities: Secondary | ICD-10-CM | POA: Diagnosis not present

## 2022-08-20 DIAGNOSIS — B351 Tinea unguium: Secondary | ICD-10-CM

## 2022-08-20 NOTE — Addendum Note (Signed)
Addended by: GRISELLE PEREZ, [place] Floor on: 08/20/2022 04:40 PM   Modules accepted: Orders  

## 2022-08-20 NOTE — Progress Notes (Signed)
  Subjective:  Patient ID: Leslie Rios, female    DOB: 1940/03/20,  MRN: [id number]  Chief Complaint  Patient presents with   Callouses    Callus to right foot- top of foot. Painful.     82 y.o. female presents for painful callus on the right foot.  Also concern for painful nails to the bilateral foot she is unable to trim her self and she is not getting them trimmed at her assisted living facility.  Patient does not have a history of diabetes.  Past Medical History:  Diagnosis Date   Alzheimer disease (HCC)    Cystitis    GERD (gastroesophageal reflux disease)    Hypo-osmolality and hyponatremia    Renal disorder     Allergies  Allergen Reactions   Sulfa Antibiotics Rash   Sulfamethizole     ROS: Negative except as per HPI above  Objective:  General: AAO x3, NAD  Dermatological: Bilateral nails with thickening yellow discoloration onycholysis and subungual debris consistent with onychomycosis with dystrophic changes.  Pain due to thickness and elongation of the nails.  At the medial of the right foot first metatarsal head there is hyperkeratotic lesion with pain on palpation.  Also hyperkeratotic lesion of the dorsal lateral aspect of the fifth metatarsal head on the right foot.  No underlying ulcerations  Vascular:  Dorsalis Pedis artery and Posterior Tibial artery pedal pulses are 2/4 bilateral.  Capillary fill time < 3 sec to all digits.   Neruologic: Grossly intact via light touch bilateral. Protective threshold intact to all sites bilateral.   Musculoskeletal: HAV deformity noted right foot  Gait: Unassisted, Nonantalgic.   No images are attached to the encounter.   Assessment:   1. Callus of foot   2. Pain due to onychomycosis of toenails of both feet   3. Bunion      Plan:  Patient was evaluated and treated and all questions answered.  #Hyperkeratotic lesions/pre ulcerative calluses present right foot medial aspect first metatarsal head as well as dorsal  aspect fifth metatarsal head All symptomatic hyperkeratoses x 2 separate lesions were safely debrided with a sterile #10 blade to patient's level of comfort without incident. We discussed preventative and palliative care of these lesions including supportive and accommodative shoegear, padding, prefabricated and custom molded accommodative orthoses, use of a pumice stone and lotions/creams daily.  #Onychomycosis with pain  -Nails palliatively debrided as below. -Educated on self-care  Procedure: Nail Debridement Rationale: Pain Type of Debridement: manual, sharp debridement. Instrumentation: Nail nipper, rotary burr. Number of Nails: 10  Return in about 3 months (around 11/20/2022) for RFC.          Corinna Gab, DPM [facility]

## 2022-08-23 DIAGNOSIS — F0283 Dementia in other diseases classified elsewhere, unspecified severity, with mood disturbance: Secondary | ICD-10-CM | POA: Diagnosis not present

## 2022-08-23 DIAGNOSIS — Z9181 History of falling: Secondary | ICD-10-CM | POA: Diagnosis not present

## 2022-08-23 DIAGNOSIS — F0284 Dementia in other diseases classified elsewhere, unspecified severity, with anxiety: Secondary | ICD-10-CM | POA: Diagnosis not present

## 2022-08-23 DIAGNOSIS — K219 Gastro-esophageal reflux disease without esophagitis: Secondary | ICD-10-CM | POA: Diagnosis not present

## 2022-08-23 DIAGNOSIS — F331 Major depressive disorder, recurrent, moderate: Secondary | ICD-10-CM | POA: Diagnosis not present

## 2022-08-23 DIAGNOSIS — F5101 Primary insomnia: Secondary | ICD-10-CM | POA: Diagnosis not present

## 2022-08-23 DIAGNOSIS — N390 Urinary tract infection, site not specified: Secondary | ICD-10-CM | POA: Diagnosis not present

## 2022-08-23 DIAGNOSIS — N189 Chronic kidney disease, unspecified: Secondary | ICD-10-CM | POA: Diagnosis not present

## 2022-08-23 DIAGNOSIS — G301 Alzheimer's disease with late onset: Secondary | ICD-10-CM | POA: Diagnosis not present

## 2022-08-26 DIAGNOSIS — N189 Chronic kidney disease, unspecified: Secondary | ICD-10-CM | POA: Diagnosis not present

## 2022-08-26 DIAGNOSIS — F331 Major depressive disorder, recurrent, moderate: Secondary | ICD-10-CM | POA: Diagnosis not present

## 2022-08-26 DIAGNOSIS — F0283 Dementia in other diseases classified elsewhere, unspecified severity, with mood disturbance: Secondary | ICD-10-CM | POA: Diagnosis not present

## 2022-08-26 DIAGNOSIS — Z9181 History of falling: Secondary | ICD-10-CM | POA: Diagnosis not present

## 2022-08-26 DIAGNOSIS — F5101 Primary insomnia: Secondary | ICD-10-CM | POA: Diagnosis not present

## 2022-08-26 DIAGNOSIS — G301 Alzheimer's disease with late onset: Secondary | ICD-10-CM | POA: Diagnosis not present

## 2022-08-26 DIAGNOSIS — F0284 Dementia in other diseases classified elsewhere, unspecified severity, with anxiety: Secondary | ICD-10-CM | POA: Diagnosis not present

## 2022-08-26 DIAGNOSIS — K219 Gastro-esophageal reflux disease without esophagitis: Secondary | ICD-10-CM | POA: Diagnosis not present

## 2022-08-26 DIAGNOSIS — N390 Urinary tract infection, site not specified: Secondary | ICD-10-CM | POA: Diagnosis not present

## 2022-08-27 DIAGNOSIS — Z79899 Other long term (current) drug therapy: Secondary | ICD-10-CM | POA: Diagnosis not present

## 2022-08-29 DIAGNOSIS — F02B3 Dementia in other diseases classified elsewhere, moderate, with mood disturbance: Secondary | ICD-10-CM | POA: Diagnosis not present

## 2022-08-29 DIAGNOSIS — G301 Alzheimer's disease with late onset: Secondary | ICD-10-CM | POA: Diagnosis not present

## 2022-08-29 DIAGNOSIS — F5101 Primary insomnia: Secondary | ICD-10-CM | POA: Diagnosis not present

## 2022-08-29 DIAGNOSIS — F419 Anxiety disorder, unspecified: Secondary | ICD-10-CM | POA: Diagnosis not present

## 2022-09-04 DIAGNOSIS — F5101 Primary insomnia: Secondary | ICD-10-CM | POA: Diagnosis not present

## 2022-09-04 DIAGNOSIS — G301 Alzheimer's disease with late onset: Secondary | ICD-10-CM | POA: Diagnosis not present

## 2022-09-04 DIAGNOSIS — K219 Gastro-esophageal reflux disease without esophagitis: Secondary | ICD-10-CM | POA: Diagnosis not present

## 2022-09-04 DIAGNOSIS — N390 Urinary tract infection, site not specified: Secondary | ICD-10-CM | POA: Diagnosis not present

## 2022-09-04 DIAGNOSIS — N189 Chronic kidney disease, unspecified: Secondary | ICD-10-CM | POA: Diagnosis not present

## 2022-09-04 DIAGNOSIS — F331 Major depressive disorder, recurrent, moderate: Secondary | ICD-10-CM | POA: Diagnosis not present

## 2022-09-04 DIAGNOSIS — Z9181 History of falling: Secondary | ICD-10-CM | POA: Diagnosis not present

## 2022-09-04 DIAGNOSIS — F0283 Dementia in other diseases classified elsewhere, unspecified severity, with mood disturbance: Secondary | ICD-10-CM | POA: Diagnosis not present

## 2022-09-04 DIAGNOSIS — F0284 Dementia in other diseases classified elsewhere, unspecified severity, with anxiety: Secondary | ICD-10-CM | POA: Diagnosis not present

## 2022-09-05 DIAGNOSIS — Z9181 History of falling: Secondary | ICD-10-CM | POA: Diagnosis not present

## 2022-09-05 DIAGNOSIS — R269 Unspecified abnormalities of gait and mobility: Secondary | ICD-10-CM | POA: Diagnosis not present

## 2022-09-05 DIAGNOSIS — W19XXXA Unspecified fall, initial encounter: Secondary | ICD-10-CM | POA: Diagnosis not present

## 2022-09-05 DIAGNOSIS — M6281 Muscle weakness (generalized): Secondary | ICD-10-CM | POA: Diagnosis not present

## 2022-09-05 DIAGNOSIS — R2689 Other abnormalities of gait and mobility: Secondary | ICD-10-CM | POA: Diagnosis not present

## 2022-09-05 DIAGNOSIS — G309 Alzheimer's disease, unspecified: Secondary | ICD-10-CM | POA: Diagnosis not present

## 2022-09-10 DIAGNOSIS — Z79899 Other long term (current) drug therapy: Secondary | ICD-10-CM | POA: Diagnosis not present

## 2022-09-11 DIAGNOSIS — K219 Gastro-esophageal reflux disease without esophagitis: Secondary | ICD-10-CM | POA: Diagnosis not present

## 2022-09-11 DIAGNOSIS — F0283 Dementia in other diseases classified elsewhere, unspecified severity, with mood disturbance: Secondary | ICD-10-CM | POA: Diagnosis not present

## 2022-09-11 DIAGNOSIS — Z9181 History of falling: Secondary | ICD-10-CM | POA: Diagnosis not present

## 2022-09-11 DIAGNOSIS — G301 Alzheimer's disease with late onset: Secondary | ICD-10-CM | POA: Diagnosis not present

## 2022-09-11 DIAGNOSIS — N390 Urinary tract infection, site not specified: Secondary | ICD-10-CM | POA: Diagnosis not present

## 2022-09-11 DIAGNOSIS — F5101 Primary insomnia: Secondary | ICD-10-CM | POA: Diagnosis not present

## 2022-09-11 DIAGNOSIS — F331 Major depressive disorder, recurrent, moderate: Secondary | ICD-10-CM | POA: Diagnosis not present

## 2022-09-11 DIAGNOSIS — F0284 Dementia in other diseases classified elsewhere, unspecified severity, with anxiety: Secondary | ICD-10-CM | POA: Diagnosis not present

## 2022-09-11 DIAGNOSIS — N189 Chronic kidney disease, unspecified: Secondary | ICD-10-CM | POA: Diagnosis not present

## 2022-09-12 DIAGNOSIS — E039 Hypothyroidism, unspecified: Secondary | ICD-10-CM | POA: Diagnosis not present

## 2022-09-12 DIAGNOSIS — N183 Chronic kidney disease, stage 3 unspecified: Secondary | ICD-10-CM | POA: Diagnosis not present

## 2022-09-12 DIAGNOSIS — G309 Alzheimer's disease, unspecified: Secondary | ICD-10-CM | POA: Diagnosis not present

## 2022-09-12 DIAGNOSIS — E785 Hyperlipidemia, unspecified: Secondary | ICD-10-CM | POA: Diagnosis not present

## 2022-09-12 DIAGNOSIS — I129 Hypertensive chronic kidney disease with stage 1 through stage 4 chronic kidney disease, or unspecified chronic kidney disease: Secondary | ICD-10-CM | POA: Diagnosis not present

## 2022-09-12 DIAGNOSIS — D509 Iron deficiency anemia, unspecified: Secondary | ICD-10-CM | POA: Diagnosis not present

## 2022-09-12 DIAGNOSIS — M6281 Muscle weakness (generalized): Secondary | ICD-10-CM | POA: Diagnosis not present

## 2022-09-12 DIAGNOSIS — I5032 Chronic diastolic (congestive) heart failure: Secondary | ICD-10-CM | POA: Diagnosis not present

## 2022-09-20 DIAGNOSIS — Z9181 History of falling: Secondary | ICD-10-CM | POA: Diagnosis not present

## 2022-09-20 DIAGNOSIS — N390 Urinary tract infection, site not specified: Secondary | ICD-10-CM | POA: Diagnosis not present

## 2022-09-20 DIAGNOSIS — F5101 Primary insomnia: Secondary | ICD-10-CM | POA: Diagnosis not present

## 2022-09-20 DIAGNOSIS — G301 Alzheimer's disease with late onset: Secondary | ICD-10-CM | POA: Diagnosis not present

## 2022-09-20 DIAGNOSIS — N189 Chronic kidney disease, unspecified: Secondary | ICD-10-CM | POA: Diagnosis not present

## 2022-09-20 DIAGNOSIS — F0283 Dementia in other diseases classified elsewhere, unspecified severity, with mood disturbance: Secondary | ICD-10-CM | POA: Diagnosis not present

## 2022-09-20 DIAGNOSIS — K219 Gastro-esophageal reflux disease without esophagitis: Secondary | ICD-10-CM | POA: Diagnosis not present

## 2022-09-20 DIAGNOSIS — F331 Major depressive disorder, recurrent, moderate: Secondary | ICD-10-CM | POA: Diagnosis not present

## 2022-09-20 DIAGNOSIS — F0284 Dementia in other diseases classified elsewhere, unspecified severity, with anxiety: Secondary | ICD-10-CM | POA: Diagnosis not present

## 2022-09-23 DIAGNOSIS — Z79899 Other long term (current) drug therapy: Secondary | ICD-10-CM | POA: Diagnosis not present

## 2022-09-25 DIAGNOSIS — N390 Urinary tract infection, site not specified: Secondary | ICD-10-CM | POA: Diagnosis not present

## 2022-09-25 DIAGNOSIS — N189 Chronic kidney disease, unspecified: Secondary | ICD-10-CM | POA: Diagnosis not present

## 2022-09-25 DIAGNOSIS — G301 Alzheimer's disease with late onset: Secondary | ICD-10-CM | POA: Diagnosis not present

## 2022-09-25 DIAGNOSIS — F0283 Dementia in other diseases classified elsewhere, unspecified severity, with mood disturbance: Secondary | ICD-10-CM | POA: Diagnosis not present

## 2022-09-25 DIAGNOSIS — Z9181 History of falling: Secondary | ICD-10-CM | POA: Diagnosis not present

## 2022-09-25 DIAGNOSIS — F331 Major depressive disorder, recurrent, moderate: Secondary | ICD-10-CM | POA: Diagnosis not present

## 2022-09-25 DIAGNOSIS — F0284 Dementia in other diseases classified elsewhere, unspecified severity, with anxiety: Secondary | ICD-10-CM | POA: Diagnosis not present

## 2022-09-25 DIAGNOSIS — F5101 Primary insomnia: Secondary | ICD-10-CM | POA: Diagnosis not present

## 2022-09-25 DIAGNOSIS — K219 Gastro-esophageal reflux disease without esophagitis: Secondary | ICD-10-CM | POA: Diagnosis not present

## 2022-09-26 DIAGNOSIS — F02B3 Dementia in other diseases classified elsewhere, moderate, with mood disturbance: Secondary | ICD-10-CM | POA: Diagnosis not present

## 2022-09-26 DIAGNOSIS — F411 Generalized anxiety disorder: Secondary | ICD-10-CM | POA: Diagnosis not present

## 2022-09-26 DIAGNOSIS — G301 Alzheimer's disease with late onset: Secondary | ICD-10-CM | POA: Diagnosis not present

## 2022-09-26 DIAGNOSIS — F5101 Primary insomnia: Secondary | ICD-10-CM | POA: Diagnosis not present

## 2022-10-02 DIAGNOSIS — F5101 Primary insomnia: Secondary | ICD-10-CM | POA: Diagnosis not present

## 2022-10-02 DIAGNOSIS — F0284 Dementia in other diseases classified elsewhere, unspecified severity, with anxiety: Secondary | ICD-10-CM | POA: Diagnosis not present

## 2022-10-02 DIAGNOSIS — G301 Alzheimer's disease with late onset: Secondary | ICD-10-CM | POA: Diagnosis not present

## 2022-10-02 DIAGNOSIS — N189 Chronic kidney disease, unspecified: Secondary | ICD-10-CM | POA: Diagnosis not present

## 2022-10-02 DIAGNOSIS — N390 Urinary tract infection, site not specified: Secondary | ICD-10-CM | POA: Diagnosis not present

## 2022-10-02 DIAGNOSIS — Z9181 History of falling: Secondary | ICD-10-CM | POA: Diagnosis not present

## 2022-10-02 DIAGNOSIS — F0283 Dementia in other diseases classified elsewhere, unspecified severity, with mood disturbance: Secondary | ICD-10-CM | POA: Diagnosis not present

## 2022-10-02 DIAGNOSIS — K219 Gastro-esophageal reflux disease without esophagitis: Secondary | ICD-10-CM | POA: Diagnosis not present

## 2022-10-02 DIAGNOSIS — F331 Major depressive disorder, recurrent, moderate: Secondary | ICD-10-CM | POA: Diagnosis not present

## 2022-10-07 DIAGNOSIS — N390 Urinary tract infection, site not specified: Secondary | ICD-10-CM | POA: Diagnosis not present

## 2022-10-07 DIAGNOSIS — F331 Major depressive disorder, recurrent, moderate: Secondary | ICD-10-CM | POA: Diagnosis not present

## 2022-10-07 DIAGNOSIS — K219 Gastro-esophageal reflux disease without esophagitis: Secondary | ICD-10-CM | POA: Diagnosis not present

## 2022-10-07 DIAGNOSIS — F5101 Primary insomnia: Secondary | ICD-10-CM | POA: Diagnosis not present

## 2022-10-07 DIAGNOSIS — G301 Alzheimer's disease with late onset: Secondary | ICD-10-CM | POA: Diagnosis not present

## 2022-10-07 DIAGNOSIS — F0283 Dementia in other diseases classified elsewhere, unspecified severity, with mood disturbance: Secondary | ICD-10-CM | POA: Diagnosis not present

## 2022-10-07 DIAGNOSIS — N189 Chronic kidney disease, unspecified: Secondary | ICD-10-CM | POA: Diagnosis not present

## 2022-10-07 DIAGNOSIS — F0284 Dementia in other diseases classified elsewhere, unspecified severity, with anxiety: Secondary | ICD-10-CM | POA: Diagnosis not present

## 2022-10-07 DIAGNOSIS — Z9181 History of falling: Secondary | ICD-10-CM | POA: Diagnosis not present

## 2022-10-08 DIAGNOSIS — Z79899 Other long term (current) drug therapy: Secondary | ICD-10-CM | POA: Diagnosis not present

## 2022-10-24 DIAGNOSIS — Z79899 Other long term (current) drug therapy: Secondary | ICD-10-CM | POA: Diagnosis not present

## 2022-10-24 DIAGNOSIS — F02B3 Dementia in other diseases classified elsewhere, moderate, with mood disturbance: Secondary | ICD-10-CM | POA: Diagnosis not present

## 2022-10-24 DIAGNOSIS — G4700 Insomnia, unspecified: Secondary | ICD-10-CM | POA: Diagnosis not present

## 2022-10-24 DIAGNOSIS — E038 Other specified hypothyroidism: Secondary | ICD-10-CM | POA: Diagnosis not present

## 2022-10-24 DIAGNOSIS — F411 Generalized anxiety disorder: Secondary | ICD-10-CM | POA: Diagnosis not present

## 2022-10-24 DIAGNOSIS — G301 Alzheimer's disease with late onset: Secondary | ICD-10-CM | POA: Diagnosis not present

## 2022-11-05 DIAGNOSIS — Z79899 Other long term (current) drug therapy: Secondary | ICD-10-CM | POA: Diagnosis not present

## 2022-11-15 DIAGNOSIS — N390 Urinary tract infection, site not specified: Secondary | ICD-10-CM | POA: Diagnosis not present

## 2022-11-19 DIAGNOSIS — Z79899 Other long term (current) drug therapy: Secondary | ICD-10-CM | POA: Diagnosis not present

## 2022-11-21 DIAGNOSIS — G309 Alzheimer's disease, unspecified: Secondary | ICD-10-CM | POA: Diagnosis not present

## 2022-11-21 DIAGNOSIS — G4700 Insomnia, unspecified: Secondary | ICD-10-CM | POA: Diagnosis not present

## 2022-11-21 DIAGNOSIS — N390 Urinary tract infection, site not specified: Secondary | ICD-10-CM | POA: Diagnosis not present

## 2022-11-21 DIAGNOSIS — F411 Generalized anxiety disorder: Secondary | ICD-10-CM | POA: Diagnosis not present

## 2022-11-21 DIAGNOSIS — G301 Alzheimer's disease with late onset: Secondary | ICD-10-CM | POA: Diagnosis not present

## 2022-11-21 DIAGNOSIS — B9689 Other specified bacterial agents as the cause of diseases classified elsewhere: Secondary | ICD-10-CM | POA: Diagnosis not present

## 2022-11-21 DIAGNOSIS — Z87440 Personal history of urinary (tract) infections: Secondary | ICD-10-CM | POA: Diagnosis not present

## 2022-11-21 DIAGNOSIS — F02B3 Dementia in other diseases classified elsewhere, moderate, with mood disturbance: Secondary | ICD-10-CM | POA: Diagnosis not present

## 2022-12-03 DIAGNOSIS — Z79899 Other long term (current) drug therapy: Secondary | ICD-10-CM | POA: Diagnosis not present

## 2022-12-17 DIAGNOSIS — F321 Major depressive disorder, single episode, moderate: Secondary | ICD-10-CM | POA: Diagnosis not present

## 2022-12-17 DIAGNOSIS — I10 Essential (primary) hypertension: Secondary | ICD-10-CM | POA: Diagnosis not present

## 2022-12-17 DIAGNOSIS — Z79899 Other long term (current) drug therapy: Secondary | ICD-10-CM | POA: Diagnosis not present

## 2022-12-19 DIAGNOSIS — F02B3 Dementia in other diseases classified elsewhere, moderate, with mood disturbance: Secondary | ICD-10-CM | POA: Diagnosis not present

## 2022-12-19 DIAGNOSIS — G4700 Insomnia, unspecified: Secondary | ICD-10-CM | POA: Diagnosis not present

## 2022-12-19 DIAGNOSIS — G301 Alzheimer's disease with late onset: Secondary | ICD-10-CM | POA: Diagnosis not present

## 2022-12-19 DIAGNOSIS — F411 Generalized anxiety disorder: Secondary | ICD-10-CM | POA: Diagnosis not present

## 2022-12-26 DIAGNOSIS — I129 Hypertensive chronic kidney disease with stage 1 through stage 4 chronic kidney disease, or unspecified chronic kidney disease: Secondary | ICD-10-CM | POA: Diagnosis not present

## 2022-12-26 DIAGNOSIS — N1831 Chronic kidney disease, stage 3a: Secondary | ICD-10-CM | POA: Diagnosis not present

## 2022-12-26 DIAGNOSIS — D509 Iron deficiency anemia, unspecified: Secondary | ICD-10-CM | POA: Diagnosis not present

## 2022-12-26 DIAGNOSIS — I5032 Chronic diastolic (congestive) heart failure: Secondary | ICD-10-CM | POA: Diagnosis not present

## 2022-12-26 DIAGNOSIS — R7303 Prediabetes: Secondary | ICD-10-CM | POA: Diagnosis not present

## 2022-12-26 DIAGNOSIS — E039 Hypothyroidism, unspecified: Secondary | ICD-10-CM

## 2022-12-26 DIAGNOSIS — E785 Hyperlipidemia, unspecified: Secondary | ICD-10-CM | POA: Diagnosis not present

## 2022-12-26 DIAGNOSIS — R1310 Dysphagia, unspecified: Secondary | ICD-10-CM | POA: Diagnosis not present

## 2022-12-26 DIAGNOSIS — M1990 Unspecified osteoarthritis, unspecified site: Secondary | ICD-10-CM | POA: Diagnosis not present

## 2022-12-26 DIAGNOSIS — F0390 Unspecified dementia without behavioral disturbance: Secondary | ICD-10-CM | POA: Diagnosis not present

## 2022-12-31 DIAGNOSIS — Z79899 Other long term (current) drug therapy: Secondary | ICD-10-CM | POA: Diagnosis not present

## 2023-01-14 DIAGNOSIS — Z79899 Other long term (current) drug therapy: Secondary | ICD-10-CM | POA: Diagnosis not present

## 2023-01-14 DIAGNOSIS — E038 Other specified hypothyroidism: Secondary | ICD-10-CM | POA: Diagnosis not present

## 2023-01-14 DIAGNOSIS — E119 Type 2 diabetes mellitus without complications: Secondary | ICD-10-CM | POA: Diagnosis not present

## 2023-01-14 DIAGNOSIS — D519 Vitamin B12 deficiency anemia, unspecified: Secondary | ICD-10-CM | POA: Diagnosis not present

## 2023-01-14 DIAGNOSIS — E782 Mixed hyperlipidemia: Secondary | ICD-10-CM | POA: Diagnosis not present

## 2023-01-23 DIAGNOSIS — G309 Alzheimer's disease, unspecified: Secondary | ICD-10-CM | POA: Diagnosis not present

## 2023-01-23 DIAGNOSIS — D509 Iron deficiency anemia, unspecified: Secondary | ICD-10-CM | POA: Diagnosis not present

## 2023-01-23 DIAGNOSIS — R7303 Prediabetes: Secondary | ICD-10-CM | POA: Diagnosis not present

## 2023-01-30 DIAGNOSIS — Z79899 Other long term (current) drug therapy: Secondary | ICD-10-CM | POA: Diagnosis not present

## 2023-02-17 DIAGNOSIS — Z79899 Other long term (current) drug therapy: Secondary | ICD-10-CM | POA: Diagnosis not present

## 2023-03-04 DIAGNOSIS — Z79899 Other long term (current) drug therapy: Secondary | ICD-10-CM | POA: Diagnosis not present

## 2023-03-13 DIAGNOSIS — F411 Generalized anxiety disorder: Secondary | ICD-10-CM | POA: Diagnosis not present

## 2023-03-13 DIAGNOSIS — G301 Alzheimer's disease with late onset: Secondary | ICD-10-CM | POA: Diagnosis not present

## 2023-03-13 DIAGNOSIS — F02B3 Dementia in other diseases classified elsewhere, moderate, with mood disturbance: Secondary | ICD-10-CM | POA: Diagnosis not present

## 2023-03-13 DIAGNOSIS — G4700 Insomnia, unspecified: Secondary | ICD-10-CM | POA: Diagnosis not present

## 2023-03-18 DIAGNOSIS — Z79899 Other long term (current) drug therapy: Secondary | ICD-10-CM | POA: Diagnosis not present

## 2023-03-27 DIAGNOSIS — R0989 Other specified symptoms and signs involving the circulatory and respiratory systems: Secondary | ICD-10-CM | POA: Diagnosis not present

## 2023-03-27 DIAGNOSIS — J069 Acute upper respiratory infection, unspecified: Secondary | ICD-10-CM | POA: Diagnosis not present

## 2023-03-27 DIAGNOSIS — R5381 Other malaise: Secondary | ICD-10-CM | POA: Diagnosis not present

## 2023-04-02 DIAGNOSIS — Z79899 Other long term (current) drug therapy: Secondary | ICD-10-CM | POA: Diagnosis not present

## 2023-04-03 DIAGNOSIS — J09X2 Influenza due to identified novel influenza A virus with other respiratory manifestations: Secondary | ICD-10-CM | POA: Diagnosis not present

## 2023-04-03 DIAGNOSIS — J101 Influenza due to other identified influenza virus with other respiratory manifestations: Secondary | ICD-10-CM | POA: Diagnosis not present

## 2023-04-03 DIAGNOSIS — G309 Alzheimer's disease, unspecified: Secondary | ICD-10-CM | POA: Diagnosis not present

## 2023-04-03 DIAGNOSIS — B974 Respiratory syncytial virus as the cause of diseases classified elsewhere: Secondary | ICD-10-CM | POA: Diagnosis not present

## 2023-04-03 DIAGNOSIS — R059 Cough, unspecified: Secondary | ICD-10-CM | POA: Diagnosis not present

## 2023-04-03 DIAGNOSIS — R5381 Other malaise: Secondary | ICD-10-CM | POA: Diagnosis not present

## 2023-04-10 DIAGNOSIS — F411 Generalized anxiety disorder: Secondary | ICD-10-CM | POA: Diagnosis not present

## 2023-04-10 DIAGNOSIS — F02B3 Dementia in other diseases classified elsewhere, moderate, with mood disturbance: Secondary | ICD-10-CM | POA: Diagnosis not present

## 2023-04-10 DIAGNOSIS — G4700 Insomnia, unspecified: Secondary | ICD-10-CM | POA: Diagnosis not present

## 2023-04-10 DIAGNOSIS — G301 Alzheimer's disease with late onset: Secondary | ICD-10-CM | POA: Diagnosis not present

## 2023-04-10 DIAGNOSIS — F331 Major depressive disorder, recurrent, moderate: Secondary | ICD-10-CM | POA: Diagnosis not present

## 2023-04-11 DIAGNOSIS — G309 Alzheimer's disease, unspecified: Secondary | ICD-10-CM | POA: Diagnosis not present

## 2023-04-11 DIAGNOSIS — M6281 Muscle weakness (generalized): Secondary | ICD-10-CM | POA: Diagnosis not present

## 2023-04-11 DIAGNOSIS — R2689 Other abnormalities of gait and mobility: Secondary | ICD-10-CM | POA: Diagnosis not present

## 2023-04-16 DIAGNOSIS — F0284 Dementia in other diseases classified elsewhere, unspecified severity, with anxiety: Secondary | ICD-10-CM | POA: Diagnosis not present

## 2023-04-16 DIAGNOSIS — F331 Major depressive disorder, recurrent, moderate: Secondary | ICD-10-CM | POA: Diagnosis not present

## 2023-04-16 DIAGNOSIS — Z79899 Other long term (current) drug therapy: Secondary | ICD-10-CM | POA: Diagnosis not present

## 2023-04-16 DIAGNOSIS — J1089 Influenza due to other identified influenza virus with other manifestations: Secondary | ICD-10-CM | POA: Diagnosis not present

## 2023-04-16 DIAGNOSIS — B974 Respiratory syncytial virus as the cause of diseases classified elsewhere: Secondary | ICD-10-CM | POA: Diagnosis not present

## 2023-04-16 DIAGNOSIS — K219 Gastro-esophageal reflux disease without esophagitis: Secondary | ICD-10-CM | POA: Diagnosis not present

## 2023-04-16 DIAGNOSIS — F0283 Dementia in other diseases classified elsewhere, unspecified severity, with mood disturbance: Secondary | ICD-10-CM | POA: Diagnosis not present

## 2023-04-16 DIAGNOSIS — F5101 Primary insomnia: Secondary | ICD-10-CM | POA: Diagnosis not present

## 2023-04-16 DIAGNOSIS — G301 Alzheimer's disease with late onset: Secondary | ICD-10-CM | POA: Diagnosis not present

## 2023-04-16 DIAGNOSIS — N189 Chronic kidney disease, unspecified: Secondary | ICD-10-CM | POA: Diagnosis not present

## 2023-04-21 DIAGNOSIS — N189 Chronic kidney disease, unspecified: Secondary | ICD-10-CM | POA: Diagnosis not present

## 2023-04-21 DIAGNOSIS — F331 Major depressive disorder, recurrent, moderate: Secondary | ICD-10-CM | POA: Diagnosis not present

## 2023-04-21 DIAGNOSIS — G301 Alzheimer's disease with late onset: Secondary | ICD-10-CM | POA: Diagnosis not present

## 2023-04-21 DIAGNOSIS — B974 Respiratory syncytial virus as the cause of diseases classified elsewhere: Secondary | ICD-10-CM | POA: Diagnosis not present

## 2023-04-21 DIAGNOSIS — J1089 Influenza due to other identified influenza virus with other manifestations: Secondary | ICD-10-CM | POA: Diagnosis not present

## 2023-04-21 DIAGNOSIS — F0284 Dementia in other diseases classified elsewhere, unspecified severity, with anxiety: Secondary | ICD-10-CM | POA: Diagnosis not present

## 2023-04-21 DIAGNOSIS — F0283 Dementia in other diseases classified elsewhere, unspecified severity, with mood disturbance: Secondary | ICD-10-CM | POA: Diagnosis not present

## 2023-04-21 DIAGNOSIS — F5101 Primary insomnia: Secondary | ICD-10-CM | POA: Diagnosis not present

## 2023-04-21 DIAGNOSIS — K219 Gastro-esophageal reflux disease without esophagitis: Secondary | ICD-10-CM | POA: Diagnosis not present

## 2023-04-23 DIAGNOSIS — N189 Chronic kidney disease, unspecified: Secondary | ICD-10-CM | POA: Diagnosis not present

## 2023-04-23 DIAGNOSIS — K219 Gastro-esophageal reflux disease without esophagitis: Secondary | ICD-10-CM | POA: Diagnosis not present

## 2023-04-23 DIAGNOSIS — B974 Respiratory syncytial virus as the cause of diseases classified elsewhere: Secondary | ICD-10-CM | POA: Diagnosis not present

## 2023-04-23 DIAGNOSIS — F0283 Dementia in other diseases classified elsewhere, unspecified severity, with mood disturbance: Secondary | ICD-10-CM | POA: Diagnosis not present

## 2023-04-23 DIAGNOSIS — F331 Major depressive disorder, recurrent, moderate: Secondary | ICD-10-CM | POA: Diagnosis not present

## 2023-04-23 DIAGNOSIS — G301 Alzheimer's disease with late onset: Secondary | ICD-10-CM | POA: Diagnosis not present

## 2023-04-23 DIAGNOSIS — F5101 Primary insomnia: Secondary | ICD-10-CM | POA: Diagnosis not present

## 2023-04-23 DIAGNOSIS — F0284 Dementia in other diseases classified elsewhere, unspecified severity, with anxiety: Secondary | ICD-10-CM | POA: Diagnosis not present

## 2023-04-23 DIAGNOSIS — J1089 Influenza due to other identified influenza virus with other manifestations: Secondary | ICD-10-CM | POA: Diagnosis not present

## 2023-04-28 DIAGNOSIS — Z79899 Other long term (current) drug therapy: Secondary | ICD-10-CM | POA: Diagnosis not present

## 2023-04-28 DIAGNOSIS — B974 Respiratory syncytial virus as the cause of diseases classified elsewhere: Secondary | ICD-10-CM | POA: Diagnosis not present

## 2023-04-28 DIAGNOSIS — J1089 Influenza due to other identified influenza virus with other manifestations: Secondary | ICD-10-CM | POA: Diagnosis not present

## 2023-04-28 DIAGNOSIS — N189 Chronic kidney disease, unspecified: Secondary | ICD-10-CM | POA: Diagnosis not present

## 2023-04-28 DIAGNOSIS — K219 Gastro-esophageal reflux disease without esophagitis: Secondary | ICD-10-CM | POA: Diagnosis not present

## 2023-04-28 DIAGNOSIS — F5101 Primary insomnia: Secondary | ICD-10-CM | POA: Diagnosis not present

## 2023-04-28 DIAGNOSIS — G301 Alzheimer's disease with late onset: Secondary | ICD-10-CM | POA: Diagnosis not present

## 2023-04-28 DIAGNOSIS — F331 Major depressive disorder, recurrent, moderate: Secondary | ICD-10-CM | POA: Diagnosis not present

## 2023-04-28 DIAGNOSIS — F0283 Dementia in other diseases classified elsewhere, unspecified severity, with mood disturbance: Secondary | ICD-10-CM | POA: Diagnosis not present

## 2023-04-28 DIAGNOSIS — F0284 Dementia in other diseases classified elsewhere, unspecified severity, with anxiety: Secondary | ICD-10-CM | POA: Diagnosis not present

## 2023-04-30 DIAGNOSIS — B974 Respiratory syncytial virus as the cause of diseases classified elsewhere: Secondary | ICD-10-CM | POA: Diagnosis not present

## 2023-04-30 DIAGNOSIS — K219 Gastro-esophageal reflux disease without esophagitis: Secondary | ICD-10-CM | POA: Diagnosis not present

## 2023-04-30 DIAGNOSIS — N189 Chronic kidney disease, unspecified: Secondary | ICD-10-CM | POA: Diagnosis not present

## 2023-04-30 DIAGNOSIS — F0284 Dementia in other diseases classified elsewhere, unspecified severity, with anxiety: Secondary | ICD-10-CM | POA: Diagnosis not present

## 2023-04-30 DIAGNOSIS — F0283 Dementia in other diseases classified elsewhere, unspecified severity, with mood disturbance: Secondary | ICD-10-CM | POA: Diagnosis not present

## 2023-04-30 DIAGNOSIS — G301 Alzheimer's disease with late onset: Secondary | ICD-10-CM | POA: Diagnosis not present

## 2023-04-30 DIAGNOSIS — J1089 Influenza due to other identified influenza virus with other manifestations: Secondary | ICD-10-CM | POA: Diagnosis not present

## 2023-04-30 DIAGNOSIS — F331 Major depressive disorder, recurrent, moderate: Secondary | ICD-10-CM | POA: Diagnosis not present

## 2023-04-30 DIAGNOSIS — F5101 Primary insomnia: Secondary | ICD-10-CM | POA: Diagnosis not present

## 2023-05-01 DIAGNOSIS — M6281 Muscle weakness (generalized): Secondary | ICD-10-CM

## 2023-05-01 DIAGNOSIS — E559 Vitamin D deficiency, unspecified: Secondary | ICD-10-CM | POA: Diagnosis not present

## 2023-05-01 DIAGNOSIS — E785 Hyperlipidemia, unspecified: Secondary | ICD-10-CM | POA: Diagnosis not present

## 2023-05-01 DIAGNOSIS — F0390 Unspecified dementia without behavioral disturbance: Secondary | ICD-10-CM | POA: Diagnosis not present

## 2023-05-01 DIAGNOSIS — D509 Iron deficiency anemia, unspecified: Secondary | ICD-10-CM | POA: Diagnosis not present

## 2023-05-01 DIAGNOSIS — I129 Hypertensive chronic kidney disease with stage 1 through stage 4 chronic kidney disease, or unspecified chronic kidney disease: Secondary | ICD-10-CM | POA: Diagnosis not present

## 2023-05-01 DIAGNOSIS — I5032 Chronic diastolic (congestive) heart failure: Secondary | ICD-10-CM | POA: Diagnosis not present

## 2023-05-01 DIAGNOSIS — R7303 Prediabetes: Secondary | ICD-10-CM | POA: Diagnosis not present

## 2023-05-01 DIAGNOSIS — E039 Hypothyroidism, unspecified: Secondary | ICD-10-CM | POA: Diagnosis not present

## 2023-05-01 DIAGNOSIS — N1831 Chronic kidney disease, stage 3a: Secondary | ICD-10-CM | POA: Diagnosis not present

## 2023-05-05 DIAGNOSIS — N189 Chronic kidney disease, unspecified: Secondary | ICD-10-CM | POA: Diagnosis not present

## 2023-05-05 DIAGNOSIS — J1089 Influenza due to other identified influenza virus with other manifestations: Secondary | ICD-10-CM | POA: Diagnosis not present

## 2023-05-05 DIAGNOSIS — G301 Alzheimer's disease with late onset: Secondary | ICD-10-CM | POA: Diagnosis not present

## 2023-05-05 DIAGNOSIS — B974 Respiratory syncytial virus as the cause of diseases classified elsewhere: Secondary | ICD-10-CM | POA: Diagnosis not present

## 2023-05-05 DIAGNOSIS — F5101 Primary insomnia: Secondary | ICD-10-CM | POA: Diagnosis not present

## 2023-05-05 DIAGNOSIS — F331 Major depressive disorder, recurrent, moderate: Secondary | ICD-10-CM | POA: Diagnosis not present

## 2023-05-05 DIAGNOSIS — F0284 Dementia in other diseases classified elsewhere, unspecified severity, with anxiety: Secondary | ICD-10-CM | POA: Diagnosis not present

## 2023-05-05 DIAGNOSIS — K219 Gastro-esophageal reflux disease without esophagitis: Secondary | ICD-10-CM | POA: Diagnosis not present

## 2023-05-05 DIAGNOSIS — F0283 Dementia in other diseases classified elsewhere, unspecified severity, with mood disturbance: Secondary | ICD-10-CM | POA: Diagnosis not present

## 2023-05-08 DIAGNOSIS — F411 Generalized anxiety disorder: Secondary | ICD-10-CM | POA: Diagnosis not present

## 2023-05-08 DIAGNOSIS — G4700 Insomnia, unspecified: Secondary | ICD-10-CM | POA: Diagnosis not present

## 2023-05-08 DIAGNOSIS — F02B3 Dementia in other diseases classified elsewhere, moderate, with mood disturbance: Secondary | ICD-10-CM | POA: Diagnosis not present

## 2023-05-08 DIAGNOSIS — F331 Major depressive disorder, recurrent, moderate: Secondary | ICD-10-CM | POA: Diagnosis not present

## 2023-05-08 DIAGNOSIS — G301 Alzheimer's disease with late onset: Secondary | ICD-10-CM | POA: Diagnosis not present

## 2023-05-12 DIAGNOSIS — G301 Alzheimer's disease with late onset: Secondary | ICD-10-CM | POA: Diagnosis not present

## 2023-05-12 DIAGNOSIS — F0283 Dementia in other diseases classified elsewhere, unspecified severity, with mood disturbance: Secondary | ICD-10-CM | POA: Diagnosis not present

## 2023-05-12 DIAGNOSIS — F331 Major depressive disorder, recurrent, moderate: Secondary | ICD-10-CM | POA: Diagnosis not present

## 2023-05-12 DIAGNOSIS — N189 Chronic kidney disease, unspecified: Secondary | ICD-10-CM | POA: Diagnosis not present

## 2023-05-12 DIAGNOSIS — K219 Gastro-esophageal reflux disease without esophagitis: Secondary | ICD-10-CM | POA: Diagnosis not present

## 2023-05-12 DIAGNOSIS — J1089 Influenza due to other identified influenza virus with other manifestations: Secondary | ICD-10-CM | POA: Diagnosis not present

## 2023-05-12 DIAGNOSIS — F5101 Primary insomnia: Secondary | ICD-10-CM | POA: Diagnosis not present

## 2023-05-12 DIAGNOSIS — B974 Respiratory syncytial virus as the cause of diseases classified elsewhere: Secondary | ICD-10-CM | POA: Diagnosis not present

## 2023-05-12 DIAGNOSIS — F0284 Dementia in other diseases classified elsewhere, unspecified severity, with anxiety: Secondary | ICD-10-CM | POA: Diagnosis not present

## 2023-05-13 DIAGNOSIS — Z79899 Other long term (current) drug therapy: Secondary | ICD-10-CM | POA: Diagnosis not present

## 2023-05-20 DIAGNOSIS — G301 Alzheimer's disease with late onset: Secondary | ICD-10-CM | POA: Diagnosis not present

## 2023-05-20 DIAGNOSIS — F0284 Dementia in other diseases classified elsewhere, unspecified severity, with anxiety: Secondary | ICD-10-CM | POA: Diagnosis not present

## 2023-05-20 DIAGNOSIS — F5101 Primary insomnia: Secondary | ICD-10-CM | POA: Diagnosis not present

## 2023-05-20 DIAGNOSIS — F0283 Dementia in other diseases classified elsewhere, unspecified severity, with mood disturbance: Secondary | ICD-10-CM | POA: Diagnosis not present

## 2023-05-20 DIAGNOSIS — K219 Gastro-esophageal reflux disease without esophagitis: Secondary | ICD-10-CM | POA: Diagnosis not present

## 2023-05-20 DIAGNOSIS — B974 Respiratory syncytial virus as the cause of diseases classified elsewhere: Secondary | ICD-10-CM | POA: Diagnosis not present

## 2023-05-20 DIAGNOSIS — N189 Chronic kidney disease, unspecified: Secondary | ICD-10-CM | POA: Diagnosis not present

## 2023-05-20 DIAGNOSIS — F331 Major depressive disorder, recurrent, moderate: Secondary | ICD-10-CM | POA: Diagnosis not present

## 2023-05-20 DIAGNOSIS — J1089 Influenza due to other identified influenza virus with other manifestations: Secondary | ICD-10-CM | POA: Diagnosis not present

## 2023-05-26 DIAGNOSIS — B974 Respiratory syncytial virus as the cause of diseases classified elsewhere: Secondary | ICD-10-CM | POA: Diagnosis not present

## 2023-05-26 DIAGNOSIS — J1089 Influenza due to other identified influenza virus with other manifestations: Secondary | ICD-10-CM | POA: Diagnosis not present

## 2023-05-26 DIAGNOSIS — F331 Major depressive disorder, recurrent, moderate: Secondary | ICD-10-CM | POA: Diagnosis not present

## 2023-05-26 DIAGNOSIS — F5101 Primary insomnia: Secondary | ICD-10-CM | POA: Diagnosis not present

## 2023-05-26 DIAGNOSIS — K219 Gastro-esophageal reflux disease without esophagitis: Secondary | ICD-10-CM | POA: Diagnosis not present

## 2023-05-26 DIAGNOSIS — F0284 Dementia in other diseases classified elsewhere, unspecified severity, with anxiety: Secondary | ICD-10-CM | POA: Diagnosis not present

## 2023-05-26 DIAGNOSIS — N189 Chronic kidney disease, unspecified: Secondary | ICD-10-CM | POA: Diagnosis not present

## 2023-05-26 DIAGNOSIS — F0283 Dementia in other diseases classified elsewhere, unspecified severity, with mood disturbance: Secondary | ICD-10-CM | POA: Diagnosis not present

## 2023-05-26 DIAGNOSIS — G301 Alzheimer's disease with late onset: Secondary | ICD-10-CM | POA: Diagnosis not present

## 2023-05-27 DIAGNOSIS — Z79899 Other long term (current) drug therapy: Secondary | ICD-10-CM | POA: Diagnosis not present

## 2023-06-02 DIAGNOSIS — F0283 Dementia in other diseases classified elsewhere, unspecified severity, with mood disturbance: Secondary | ICD-10-CM | POA: Diagnosis not present

## 2023-06-02 DIAGNOSIS — J1089 Influenza due to other identified influenza virus with other manifestations: Secondary | ICD-10-CM | POA: Diagnosis not present

## 2023-06-02 DIAGNOSIS — B974 Respiratory syncytial virus as the cause of diseases classified elsewhere: Secondary | ICD-10-CM | POA: Diagnosis not present

## 2023-06-02 DIAGNOSIS — F5101 Primary insomnia: Secondary | ICD-10-CM | POA: Diagnosis not present

## 2023-06-02 DIAGNOSIS — F0284 Dementia in other diseases classified elsewhere, unspecified severity, with anxiety: Secondary | ICD-10-CM | POA: Diagnosis not present

## 2023-06-02 DIAGNOSIS — G301 Alzheimer's disease with late onset: Secondary | ICD-10-CM | POA: Diagnosis not present

## 2023-06-02 DIAGNOSIS — K219 Gastro-esophageal reflux disease without esophagitis: Secondary | ICD-10-CM | POA: Diagnosis not present

## 2023-06-02 DIAGNOSIS — N189 Chronic kidney disease, unspecified: Secondary | ICD-10-CM | POA: Diagnosis not present

## 2023-06-02 DIAGNOSIS — F331 Major depressive disorder, recurrent, moderate: Secondary | ICD-10-CM | POA: Diagnosis not present

## 2023-06-05 DIAGNOSIS — F411 Generalized anxiety disorder: Secondary | ICD-10-CM | POA: Diagnosis not present

## 2023-06-05 DIAGNOSIS — F5105 Insomnia due to other mental disorder: Secondary | ICD-10-CM | POA: Diagnosis not present

## 2023-06-05 DIAGNOSIS — G301 Alzheimer's disease with late onset: Secondary | ICD-10-CM | POA: Diagnosis not present

## 2023-06-05 DIAGNOSIS — F331 Major depressive disorder, recurrent, moderate: Secondary | ICD-10-CM | POA: Diagnosis not present

## 2023-06-05 DIAGNOSIS — F02C0 Dementia in other diseases classified elsewhere, severe, without behavioral disturbance, psychotic disturbance, mood disturbance, and anxiety: Secondary | ICD-10-CM | POA: Diagnosis not present

## 2023-06-09 DIAGNOSIS — F5101 Primary insomnia: Secondary | ICD-10-CM | POA: Diagnosis not present

## 2023-06-09 DIAGNOSIS — F0284 Dementia in other diseases classified elsewhere, unspecified severity, with anxiety: Secondary | ICD-10-CM | POA: Diagnosis not present

## 2023-06-09 DIAGNOSIS — F0283 Dementia in other diseases classified elsewhere, unspecified severity, with mood disturbance: Secondary | ICD-10-CM | POA: Diagnosis not present

## 2023-06-09 DIAGNOSIS — N189 Chronic kidney disease, unspecified: Secondary | ICD-10-CM | POA: Diagnosis not present

## 2023-06-09 DIAGNOSIS — F331 Major depressive disorder, recurrent, moderate: Secondary | ICD-10-CM | POA: Diagnosis not present

## 2023-06-09 DIAGNOSIS — B974 Respiratory syncytial virus as the cause of diseases classified elsewhere: Secondary | ICD-10-CM | POA: Diagnosis not present

## 2023-06-09 DIAGNOSIS — K219 Gastro-esophageal reflux disease without esophagitis: Secondary | ICD-10-CM | POA: Diagnosis not present

## 2023-06-09 DIAGNOSIS — J1089 Influenza due to other identified influenza virus with other manifestations: Secondary | ICD-10-CM | POA: Diagnosis not present

## 2023-06-09 DIAGNOSIS — G301 Alzheimer's disease with late onset: Secondary | ICD-10-CM | POA: Diagnosis not present

## 2023-06-11 DIAGNOSIS — Z79899 Other long term (current) drug therapy: Secondary | ICD-10-CM | POA: Diagnosis not present

## 2023-06-16 DIAGNOSIS — F418 Other specified anxiety disorders: Secondary | ICD-10-CM | POA: Diagnosis not present

## 2023-06-23 DIAGNOSIS — Z79899 Other long term (current) drug therapy: Secondary | ICD-10-CM | POA: Diagnosis not present

## 2023-06-23 DIAGNOSIS — R238 Other skin changes: Secondary | ICD-10-CM | POA: Diagnosis not present

## 2023-06-23 DIAGNOSIS — I872 Venous insufficiency (chronic) (peripheral): Secondary | ICD-10-CM | POA: Diagnosis not present

## 2023-06-23 DIAGNOSIS — R2689 Other abnormalities of gait and mobility: Secondary | ICD-10-CM | POA: Diagnosis not present

## 2023-06-23 DIAGNOSIS — L989 Disorder of the skin and subcutaneous tissue, unspecified: Secondary | ICD-10-CM | POA: Diagnosis not present

## 2023-06-23 DIAGNOSIS — M6281 Muscle weakness (generalized): Secondary | ICD-10-CM | POA: Diagnosis not present

## 2023-06-23 DIAGNOSIS — B351 Tinea unguium: Secondary | ICD-10-CM | POA: Diagnosis not present

## 2023-06-23 DIAGNOSIS — R262 Difficulty in walking, not elsewhere classified: Secondary | ICD-10-CM | POA: Diagnosis not present

## 2023-06-30 DIAGNOSIS — F411 Generalized anxiety disorder: Secondary | ICD-10-CM | POA: Diagnosis not present

## 2023-07-01 DIAGNOSIS — F02C0 Dementia in other diseases classified elsewhere, severe, without behavioral disturbance, psychotic disturbance, mood disturbance, and anxiety: Secondary | ICD-10-CM | POA: Diagnosis not present

## 2023-07-01 DIAGNOSIS — F411 Generalized anxiety disorder: Secondary | ICD-10-CM | POA: Diagnosis not present

## 2023-07-01 DIAGNOSIS — G4700 Insomnia, unspecified: Secondary | ICD-10-CM | POA: Diagnosis not present

## 2023-07-01 DIAGNOSIS — F331 Major depressive disorder, recurrent, moderate: Secondary | ICD-10-CM | POA: Diagnosis not present

## 2023-07-01 DIAGNOSIS — G301 Alzheimer's disease with late onset: Secondary | ICD-10-CM | POA: Diagnosis not present

## 2023-07-08 ENCOUNTER — Other Ambulatory Visit

## 2023-07-08 DIAGNOSIS — I10 Essential (primary) hypertension: Secondary | ICD-10-CM | POA: Diagnosis not present

## 2023-07-08 DIAGNOSIS — D649 Anemia, unspecified: Secondary | ICD-10-CM | POA: Diagnosis not present

## 2023-07-08 DIAGNOSIS — Z131 Encounter for screening for diabetes mellitus: Secondary | ICD-10-CM | POA: Diagnosis not present

## 2023-07-08 DIAGNOSIS — D509 Iron deficiency anemia, unspecified: Secondary | ICD-10-CM

## 2023-07-08 DIAGNOSIS — E559 Vitamin D deficiency, unspecified: Secondary | ICD-10-CM | POA: Diagnosis not present

## 2023-07-08 DIAGNOSIS — I5032 Chronic diastolic (congestive) heart failure: Secondary | ICD-10-CM | POA: Diagnosis not present

## 2023-07-08 DIAGNOSIS — E039 Hypothyroidism, unspecified: Secondary | ICD-10-CM

## 2023-07-08 DIAGNOSIS — N183 Chronic kidney disease, stage 3 unspecified: Secondary | ICD-10-CM | POA: Diagnosis not present

## 2023-07-08 DIAGNOSIS — E785 Hyperlipidemia, unspecified: Secondary | ICD-10-CM

## 2023-07-08 NOTE — Progress Notes (Signed)
 Lab Draw/Terra Ramona Burner

## 2023-07-09 LAB — CMP14 + ANION GAP
ALT: 21 IU/L (ref 0–32)
AST: 23 IU/L (ref 0–40)
Albumin: 4 g/dL (ref 3.7–4.7)
Alkaline Phosphatase: 136 IU/L — ABNORMAL HIGH (ref 44–121)
Anion Gap: 15 mmol/L (ref 10.0–18.0)
BUN/Creatinine Ratio: 13 (ref 12–28)
BUN: 13 mg/dL (ref 8–27)
Bilirubin Total: 0.3 mg/dL (ref 0.0–1.2)
CO2: 26 mmol/L (ref 20–29)
Calcium: 9.5 mg/dL (ref 8.7–10.3)
Chloride: 99 mmol/L (ref 96–106)
Creatinine, Ser: 0.99 mg/dL (ref 0.57–1.00)
Globulin, Total: 3.1 g/dL (ref 1.5–4.5)
Glucose: 105 mg/dL — ABNORMAL HIGH (ref 70–99)
Potassium: 4 mmol/L (ref 3.5–5.2)
Sodium: 140 mmol/L (ref 134–144)
Total Protein: 7.1 g/dL (ref 6.0–8.5)
eGFR: 57 mL/min/{1.73_m2} — ABNORMAL LOW (ref >59)

## 2023-07-09 LAB — LIPID PANEL
Chol/HDL Ratio: 3.8 ratio (ref 0.0–4.4)
Cholesterol, Total: 170 mg/dL (ref 100–199)
HDL: 45 mg/dL (ref >39)
LDL Chol Calc (NIH): 93 mg/dL (ref 0–99)
Triglycerides: 184 mg/dL — ABNORMAL HIGH (ref 0–149)
VLDL Cholesterol Cal: 32 mg/dL (ref 5–40)

## 2023-07-09 LAB — VITAMIN B12: Vitamin B-12: 468 pg/mL (ref 232–1245)

## 2023-07-09 LAB — CBC WITH DIFFERENTIAL/PLATELET
Basophils Absolute: 0.1 10*3/uL (ref 0.0–0.2)
Basos: 1 %
EOS (ABSOLUTE): 0.4 10*3/uL (ref 0.0–0.4)
Eos: 4 %
Hematocrit: 46.3 % (ref 34.0–46.6)
Hemoglobin: 15.4 g/dL (ref 11.1–15.9)
Immature Grans (Abs): 0 10*3/uL (ref 0.0–0.1)
Immature Granulocytes: 0 %
Lymphocytes Absolute: 2.1 10*3/uL (ref 0.7–3.1)
Lymphs: 22 %
MCH: 29.9 pg (ref 26.6–33.0)
MCHC: 33.3 g/dL (ref 31.5–35.7)
MCV: 90 fL (ref 79–97)
Monocytes Absolute: 0.8 10*3/uL (ref 0.1–0.9)
Monocytes: 9 %
Neutrophils Absolute: 5.8 10*3/uL (ref 1.4–7.0)
Neutrophils: 64 %
Platelets: 170 10*3/uL (ref 150–450)
RBC: 5.15 x10E6/uL (ref 3.77–5.28)
RDW: 13.3 % (ref 11.7–15.4)
WBC: 9.2 10*3/uL (ref 3.4–10.8)

## 2023-07-09 LAB — VITAMIN D 25 HYDROXY (VIT D DEFICIENCY, FRACTURES): Vit D, 25-Hydroxy: 20.6 ng/mL — ABNORMAL LOW (ref 30.0–100.0)

## 2023-07-09 LAB — MAGNESIUM: Magnesium: 2.2 mg/dL (ref 1.6–2.3)

## 2023-07-09 LAB — TSH: TSH: 2.53 u[IU]/mL (ref 0.450–4.500)

## 2023-07-09 LAB — HEMOGLOBIN A1C
Est. average glucose Bld gHb Est-mCnc: 120 mg/dL
Hgb A1c MFr Bld: 5.8 % — ABNORMAL HIGH (ref 4.8–5.6)

## 2023-07-09 LAB — SPECIMEN STATUS REPORT

## 2023-07-10 DIAGNOSIS — F0390 Unspecified dementia without behavioral disturbance: Secondary | ICD-10-CM | POA: Diagnosis not present

## 2023-07-10 DIAGNOSIS — N1831 Chronic kidney disease, stage 3a: Secondary | ICD-10-CM | POA: Diagnosis not present

## 2023-07-10 DIAGNOSIS — E1122 Type 2 diabetes mellitus with diabetic chronic kidney disease: Secondary | ICD-10-CM | POA: Diagnosis not present

## 2023-07-10 DIAGNOSIS — E559 Vitamin D deficiency, unspecified: Secondary | ICD-10-CM | POA: Diagnosis not present

## 2023-07-10 DIAGNOSIS — E785 Hyperlipidemia, unspecified: Secondary | ICD-10-CM | POA: Diagnosis not present

## 2023-07-11 DIAGNOSIS — Z79899 Other long term (current) drug therapy: Secondary | ICD-10-CM | POA: Diagnosis not present

## 2023-07-15 ENCOUNTER — Ambulatory Visit: Payer: Self-pay

## 2023-07-16 DIAGNOSIS — F411 Generalized anxiety disorder: Secondary | ICD-10-CM | POA: Diagnosis not present

## 2023-07-16 NOTE — Progress Notes (Signed)
 Patient called.  Patient aware. Her labs are ok. Patient called back 07/15/23.

## 2023-07-22 DIAGNOSIS — Z79899 Other long term (current) drug therapy: Secondary | ICD-10-CM | POA: Diagnosis not present

## 2023-07-28 DIAGNOSIS — F411 Generalized anxiety disorder: Secondary | ICD-10-CM | POA: Diagnosis not present

## 2023-07-31 DIAGNOSIS — Z9181 History of falling: Secondary | ICD-10-CM | POA: Diagnosis not present

## 2023-07-31 DIAGNOSIS — G309 Alzheimer's disease, unspecified: Secondary | ICD-10-CM | POA: Diagnosis not present

## 2023-07-31 DIAGNOSIS — R2689 Other abnormalities of gait and mobility: Secondary | ICD-10-CM | POA: Diagnosis not present

## 2023-07-31 DIAGNOSIS — M6281 Muscle weakness (generalized): Secondary | ICD-10-CM | POA: Diagnosis not present

## 2023-08-05 DIAGNOSIS — Z79899 Other long term (current) drug therapy: Secondary | ICD-10-CM | POA: Diagnosis not present

## 2023-08-07 DIAGNOSIS — I5032 Chronic diastolic (congestive) heart failure: Secondary | ICD-10-CM | POA: Diagnosis not present

## 2023-08-07 DIAGNOSIS — R600 Localized edema: Secondary | ICD-10-CM | POA: Diagnosis not present

## 2023-08-07 DIAGNOSIS — G309 Alzheimer's disease, unspecified: Secondary | ICD-10-CM | POA: Diagnosis not present

## 2023-08-11 DIAGNOSIS — F411 Generalized anxiety disorder: Secondary | ICD-10-CM | POA: Diagnosis not present

## 2023-08-14 DIAGNOSIS — F02C0 Dementia in other diseases classified elsewhere, severe, without behavioral disturbance, psychotic disturbance, mood disturbance, and anxiety: Secondary | ICD-10-CM | POA: Diagnosis not present

## 2023-08-14 DIAGNOSIS — F331 Major depressive disorder, recurrent, moderate: Secondary | ICD-10-CM | POA: Diagnosis not present

## 2023-08-14 DIAGNOSIS — G301 Alzheimer's disease with late onset: Secondary | ICD-10-CM | POA: Diagnosis not present

## 2023-08-14 DIAGNOSIS — G4700 Insomnia, unspecified: Secondary | ICD-10-CM | POA: Diagnosis not present

## 2023-08-19 DIAGNOSIS — Z79899 Other long term (current) drug therapy: Secondary | ICD-10-CM | POA: Diagnosis not present

## 2023-08-21 DIAGNOSIS — M6281 Muscle weakness (generalized): Secondary | ICD-10-CM | POA: Diagnosis not present

## 2023-08-21 DIAGNOSIS — L304 Erythema intertrigo: Secondary | ICD-10-CM | POA: Diagnosis not present

## 2023-08-21 DIAGNOSIS — F0390 Unspecified dementia without behavioral disturbance: Secondary | ICD-10-CM | POA: Diagnosis not present

## 2023-08-25 DIAGNOSIS — F331 Major depressive disorder, recurrent, moderate: Secondary | ICD-10-CM | POA: Diagnosis not present

## 2023-08-29 DIAGNOSIS — B351 Tinea unguium: Secondary | ICD-10-CM | POA: Diagnosis not present

## 2023-08-29 DIAGNOSIS — R238 Other skin changes: Secondary | ICD-10-CM | POA: Diagnosis not present

## 2023-08-29 DIAGNOSIS — R601 Generalized edema: Secondary | ICD-10-CM | POA: Diagnosis not present

## 2023-08-29 DIAGNOSIS — R2689 Other abnormalities of gait and mobility: Secondary | ICD-10-CM | POA: Diagnosis not present

## 2023-08-29 DIAGNOSIS — L84 Corns and callosities: Secondary | ICD-10-CM | POA: Diagnosis not present

## 2023-08-29 DIAGNOSIS — M6281 Muscle weakness (generalized): Secondary | ICD-10-CM | POA: Diagnosis not present

## 2023-08-29 DIAGNOSIS — R262 Difficulty in walking, not elsewhere classified: Secondary | ICD-10-CM | POA: Diagnosis not present

## 2023-08-29 DIAGNOSIS — I872 Venous insufficiency (chronic) (peripheral): Secondary | ICD-10-CM | POA: Diagnosis not present

## 2023-08-29 DIAGNOSIS — I739 Peripheral vascular disease, unspecified: Secondary | ICD-10-CM | POA: Diagnosis not present

## 2023-09-02 DIAGNOSIS — Z79899 Other long term (current) drug therapy: Secondary | ICD-10-CM | POA: Diagnosis not present

## 2023-09-04 ENCOUNTER — Other Ambulatory Visit: Payer: Self-pay

## 2023-09-04 DIAGNOSIS — R52 Pain, unspecified: Secondary | ICD-10-CM | POA: Diagnosis not present

## 2023-09-04 DIAGNOSIS — L84 Corns and callosities: Secondary | ICD-10-CM | POA: Diagnosis not present

## 2023-09-04 DIAGNOSIS — E559 Vitamin D deficiency, unspecified: Secondary | ICD-10-CM

## 2023-09-04 DIAGNOSIS — F0390 Unspecified dementia without behavioral disturbance: Secondary | ICD-10-CM | POA: Diagnosis not present

## 2023-09-04 DIAGNOSIS — M6281 Muscle weakness (generalized): Secondary | ICD-10-CM | POA: Diagnosis not present

## 2023-09-04 NOTE — Progress Notes (Signed)
 Leslie Rios

## 2023-09-05 LAB — SPECIMEN STATUS REPORT

## 2023-09-05 LAB — VITAMIN D 25 HYDROXY (VIT D DEFICIENCY, FRACTURES): Vit D, 25-Hydroxy: 36.7 ng/mL (ref 30.0–100.0)

## 2023-09-08 DIAGNOSIS — F331 Major depressive disorder, recurrent, moderate: Secondary | ICD-10-CM | POA: Diagnosis not present

## 2023-09-11 DIAGNOSIS — G4700 Insomnia, unspecified: Secondary | ICD-10-CM | POA: Diagnosis not present

## 2023-09-11 DIAGNOSIS — F331 Major depressive disorder, recurrent, moderate: Secondary | ICD-10-CM | POA: Diagnosis not present

## 2023-09-11 DIAGNOSIS — F411 Generalized anxiety disorder: Secondary | ICD-10-CM | POA: Diagnosis not present

## 2023-09-11 DIAGNOSIS — G301 Alzheimer's disease with late onset: Secondary | ICD-10-CM | POA: Diagnosis not present

## 2023-09-11 DIAGNOSIS — G309 Alzheimer's disease, unspecified: Secondary | ICD-10-CM | POA: Diagnosis not present

## 2023-09-11 DIAGNOSIS — F02C0 Dementia in other diseases classified elsewhere, severe, without behavioral disturbance, psychotic disturbance, mood disturbance, and anxiety: Secondary | ICD-10-CM | POA: Diagnosis not present

## 2023-09-11 DIAGNOSIS — E559 Vitamin D deficiency, unspecified: Secondary | ICD-10-CM | POA: Diagnosis not present

## 2023-09-18 DIAGNOSIS — Z79899 Other long term (current) drug therapy: Secondary | ICD-10-CM | POA: Diagnosis not present

## 2023-09-22 DIAGNOSIS — F331 Major depressive disorder, recurrent, moderate: Secondary | ICD-10-CM | POA: Diagnosis not present

## 2023-09-25 DIAGNOSIS — R635 Abnormal weight gain: Secondary | ICD-10-CM | POA: Diagnosis not present

## 2023-09-25 DIAGNOSIS — F0390 Unspecified dementia without behavioral disturbance: Secondary | ICD-10-CM | POA: Diagnosis not present

## 2023-09-25 DIAGNOSIS — R1310 Dysphagia, unspecified: Secondary | ICD-10-CM | POA: Diagnosis not present

## 2023-09-25 DIAGNOSIS — R600 Localized edema: Secondary | ICD-10-CM | POA: Diagnosis not present

## 2023-10-02 DIAGNOSIS — Z79899 Other long term (current) drug therapy: Secondary | ICD-10-CM | POA: Diagnosis not present

## 2023-10-06 DIAGNOSIS — F418 Other specified anxiety disorders: Secondary | ICD-10-CM | POA: Diagnosis not present

## 2023-10-09 DIAGNOSIS — G301 Alzheimer's disease with late onset: Secondary | ICD-10-CM | POA: Diagnosis not present

## 2023-10-09 DIAGNOSIS — F411 Generalized anxiety disorder: Secondary | ICD-10-CM | POA: Diagnosis not present

## 2023-10-09 DIAGNOSIS — F331 Major depressive disorder, recurrent, moderate: Secondary | ICD-10-CM | POA: Diagnosis not present

## 2023-10-09 DIAGNOSIS — R634 Abnormal weight loss: Secondary | ICD-10-CM | POA: Diagnosis not present

## 2023-10-09 DIAGNOSIS — G4700 Insomnia, unspecified: Secondary | ICD-10-CM | POA: Diagnosis not present

## 2023-10-09 DIAGNOSIS — G309 Alzheimer's disease, unspecified: Secondary | ICD-10-CM | POA: Diagnosis not present

## 2023-10-09 DIAGNOSIS — F02C0 Dementia in other diseases classified elsewhere, severe, without behavioral disturbance, psychotic disturbance, mood disturbance, and anxiety: Secondary | ICD-10-CM | POA: Diagnosis not present

## 2023-10-14 DIAGNOSIS — Z79899 Other long term (current) drug therapy: Secondary | ICD-10-CM | POA: Diagnosis not present

## 2023-10-16 DIAGNOSIS — N183 Chronic kidney disease, stage 3 unspecified: Secondary | ICD-10-CM | POA: Diagnosis not present

## 2023-10-16 DIAGNOSIS — M1990 Unspecified osteoarthritis, unspecified site: Secondary | ICD-10-CM | POA: Diagnosis not present

## 2023-10-16 DIAGNOSIS — R7303 Prediabetes: Secondary | ICD-10-CM | POA: Diagnosis not present

## 2023-10-16 DIAGNOSIS — E039 Hypothyroidism, unspecified: Secondary | ICD-10-CM | POA: Diagnosis not present

## 2023-10-16 DIAGNOSIS — I5032 Chronic diastolic (congestive) heart failure: Secondary | ICD-10-CM | POA: Diagnosis not present

## 2023-10-16 DIAGNOSIS — D509 Iron deficiency anemia, unspecified: Secondary | ICD-10-CM | POA: Diagnosis not present

## 2023-10-16 DIAGNOSIS — I129 Hypertensive chronic kidney disease with stage 1 through stage 4 chronic kidney disease, or unspecified chronic kidney disease: Secondary | ICD-10-CM | POA: Diagnosis not present

## 2023-10-16 DIAGNOSIS — E559 Vitamin D deficiency, unspecified: Secondary | ICD-10-CM

## 2023-10-16 DIAGNOSIS — R600 Localized edema: Secondary | ICD-10-CM | POA: Diagnosis not present

## 2023-10-16 DIAGNOSIS — K219 Gastro-esophageal reflux disease without esophagitis: Secondary | ICD-10-CM

## 2023-10-16 DIAGNOSIS — F0390 Unspecified dementia without behavioral disturbance: Secondary | ICD-10-CM | POA: Diagnosis not present

## 2023-10-22 DIAGNOSIS — F418 Other specified anxiety disorders: Secondary | ICD-10-CM | POA: Diagnosis not present

## 2023-10-31 DIAGNOSIS — R2689 Other abnormalities of gait and mobility: Secondary | ICD-10-CM | POA: Diagnosis not present

## 2023-10-31 DIAGNOSIS — B351 Tinea unguium: Secondary | ICD-10-CM | POA: Diagnosis not present

## 2023-10-31 DIAGNOSIS — R238 Other skin changes: Secondary | ICD-10-CM | POA: Diagnosis not present

## 2023-10-31 DIAGNOSIS — R262 Difficulty in walking, not elsewhere classified: Secondary | ICD-10-CM | POA: Diagnosis not present

## 2023-10-31 DIAGNOSIS — Z79899 Other long term (current) drug therapy: Secondary | ICD-10-CM | POA: Diagnosis not present

## 2023-10-31 DIAGNOSIS — L84 Corns and callosities: Secondary | ICD-10-CM | POA: Diagnosis not present

## 2023-10-31 DIAGNOSIS — I739 Peripheral vascular disease, unspecified: Secondary | ICD-10-CM | POA: Diagnosis not present

## 2023-10-31 DIAGNOSIS — I872 Venous insufficiency (chronic) (peripheral): Secondary | ICD-10-CM | POA: Diagnosis not present

## 2023-10-31 DIAGNOSIS — M6281 Muscle weakness (generalized): Secondary | ICD-10-CM | POA: Diagnosis not present

## 2023-10-31 DIAGNOSIS — R601 Generalized edema: Secondary | ICD-10-CM | POA: Diagnosis not present

## 2023-11-03 DIAGNOSIS — F418 Other specified anxiety disorders: Secondary | ICD-10-CM | POA: Diagnosis not present

## 2023-11-06 DIAGNOSIS — F02C0 Dementia in other diseases classified elsewhere, severe, without behavioral disturbance, psychotic disturbance, mood disturbance, and anxiety: Secondary | ICD-10-CM | POA: Diagnosis not present

## 2023-11-06 DIAGNOSIS — G301 Alzheimer's disease with late onset: Secondary | ICD-10-CM | POA: Diagnosis not present

## 2023-11-06 DIAGNOSIS — F411 Generalized anxiety disorder: Secondary | ICD-10-CM | POA: Diagnosis not present

## 2023-11-06 DIAGNOSIS — G4700 Insomnia, unspecified: Secondary | ICD-10-CM | POA: Diagnosis not present

## 2023-11-06 DIAGNOSIS — F331 Major depressive disorder, recurrent, moderate: Secondary | ICD-10-CM | POA: Diagnosis not present

## 2023-11-11 DIAGNOSIS — Z79899 Other long term (current) drug therapy: Secondary | ICD-10-CM | POA: Diagnosis not present

## 2023-11-18 DIAGNOSIS — F411 Generalized anxiety disorder: Secondary | ICD-10-CM | POA: Diagnosis not present

## 2023-11-20 DIAGNOSIS — F02C0 Dementia in other diseases classified elsewhere, severe, without behavioral disturbance, psychotic disturbance, mood disturbance, and anxiety: Secondary | ICD-10-CM | POA: Diagnosis not present

## 2023-11-20 DIAGNOSIS — G301 Alzheimer's disease with late onset: Secondary | ICD-10-CM | POA: Diagnosis not present

## 2023-11-20 DIAGNOSIS — G4700 Insomnia, unspecified: Secondary | ICD-10-CM | POA: Diagnosis not present

## 2023-11-20 DIAGNOSIS — F411 Generalized anxiety disorder: Secondary | ICD-10-CM | POA: Diagnosis not present

## 2023-11-20 DIAGNOSIS — F331 Major depressive disorder, recurrent, moderate: Secondary | ICD-10-CM | POA: Diagnosis not present

## 2023-11-25 DIAGNOSIS — Z79899 Other long term (current) drug therapy: Secondary | ICD-10-CM | POA: Diagnosis not present

## 2023-12-01 DIAGNOSIS — F411 Generalized anxiety disorder: Secondary | ICD-10-CM | POA: Diagnosis not present

## 2023-12-04 DIAGNOSIS — F331 Major depressive disorder, recurrent, moderate: Secondary | ICD-10-CM | POA: Diagnosis not present

## 2023-12-04 DIAGNOSIS — G301 Alzheimer's disease with late onset: Secondary | ICD-10-CM | POA: Diagnosis not present

## 2023-12-04 DIAGNOSIS — G4700 Insomnia, unspecified: Secondary | ICD-10-CM | POA: Diagnosis not present

## 2023-12-04 DIAGNOSIS — F02C0 Dementia in other diseases classified elsewhere, severe, without behavioral disturbance, psychotic disturbance, mood disturbance, and anxiety: Secondary | ICD-10-CM | POA: Diagnosis not present

## 2023-12-09 DIAGNOSIS — Z79899 Other long term (current) drug therapy: Secondary | ICD-10-CM | POA: Diagnosis not present

## 2023-12-23 DIAGNOSIS — Z79899 Other long term (current) drug therapy: Secondary | ICD-10-CM | POA: Diagnosis not present

## 2024-01-02 DIAGNOSIS — R262 Difficulty in walking, not elsewhere classified: Secondary | ICD-10-CM | POA: Diagnosis not present

## 2024-01-02 DIAGNOSIS — B351 Tinea unguium: Secondary | ICD-10-CM | POA: Diagnosis not present

## 2024-01-02 DIAGNOSIS — M6281 Muscle weakness (generalized): Secondary | ICD-10-CM | POA: Diagnosis not present

## 2024-01-02 DIAGNOSIS — L84 Corns and callosities: Secondary | ICD-10-CM | POA: Diagnosis not present

## 2024-01-02 DIAGNOSIS — R2689 Other abnormalities of gait and mobility: Secondary | ICD-10-CM | POA: Diagnosis not present

## 2024-01-02 DIAGNOSIS — R601 Generalized edema: Secondary | ICD-10-CM | POA: Diagnosis not present

## 2024-01-02 DIAGNOSIS — R238 Other skin changes: Secondary | ICD-10-CM | POA: Diagnosis not present

## 2024-01-02 DIAGNOSIS — I739 Peripheral vascular disease, unspecified: Secondary | ICD-10-CM | POA: Diagnosis not present

## 2024-01-02 DIAGNOSIS — I872 Venous insufficiency (chronic) (peripheral): Secondary | ICD-10-CM | POA: Diagnosis not present

## 2024-01-08 DIAGNOSIS — Z79899 Other long term (current) drug therapy: Secondary | ICD-10-CM | POA: Diagnosis not present

## 2024-01-20 DIAGNOSIS — Z79899 Other long term (current) drug therapy: Secondary | ICD-10-CM | POA: Diagnosis not present

## 2024-01-29 DIAGNOSIS — I129 Hypertensive chronic kidney disease with stage 1 through stage 4 chronic kidney disease, or unspecified chronic kidney disease: Secondary | ICD-10-CM

## 2024-01-29 DIAGNOSIS — R7303 Prediabetes: Secondary | ICD-10-CM

## 2024-01-29 DIAGNOSIS — M1990 Unspecified osteoarthritis, unspecified site: Secondary | ICD-10-CM

## 2024-01-29 DIAGNOSIS — D509 Iron deficiency anemia, unspecified: Secondary | ICD-10-CM

## 2024-03-04 DIAGNOSIS — K219 Gastro-esophageal reflux disease without esophagitis: Secondary | ICD-10-CM | POA: Diagnosis not present

## 2024-03-04 DIAGNOSIS — I129 Hypertensive chronic kidney disease with stage 1 through stage 4 chronic kidney disease, or unspecified chronic kidney disease: Secondary | ICD-10-CM | POA: Diagnosis not present

## 2024-03-04 DIAGNOSIS — N183 Chronic kidney disease, stage 3 unspecified: Secondary | ICD-10-CM | POA: Diagnosis not present

## 2024-03-04 DIAGNOSIS — D509 Iron deficiency anemia, unspecified: Secondary | ICD-10-CM | POA: Diagnosis not present
